# Patient Record
Sex: Female | Born: 1995 | Race: White | Hispanic: No | Marital: Married | Smoking: Never smoker
Health system: Southern US, Community
[De-identification: ages and names within clinical notes are randomized; demographics above are authoritative.]

## PROBLEM LIST (undated history)

## (undated) ENCOUNTER — Inpatient Hospital Stay (HOSPITAL_COMMUNITY): Payer: Self-pay

## (undated) DIAGNOSIS — Z789 Other specified health status: Secondary | ICD-10-CM

## (undated) DIAGNOSIS — R519 Headache, unspecified: Secondary | ICD-10-CM

## (undated) DIAGNOSIS — Z3490 Encounter for supervision of normal pregnancy, unspecified, unspecified trimester: Secondary | ICD-10-CM

## (undated) DIAGNOSIS — H9319 Tinnitus, unspecified ear: Secondary | ICD-10-CM

## (undated) DIAGNOSIS — A749 Chlamydial infection, unspecified: Secondary | ICD-10-CM

## (undated) DIAGNOSIS — M419 Scoliosis, unspecified: Secondary | ICD-10-CM

## (undated) DIAGNOSIS — J45909 Unspecified asthma, uncomplicated: Secondary | ICD-10-CM

## (undated) DIAGNOSIS — L309 Dermatitis, unspecified: Secondary | ICD-10-CM

## (undated) DIAGNOSIS — F419 Anxiety disorder, unspecified: Secondary | ICD-10-CM

## (undated) DIAGNOSIS — F329 Major depressive disorder, single episode, unspecified: Secondary | ICD-10-CM

## (undated) HISTORY — DX: Anxiety disorder, unspecified: F41.9

## (undated) HISTORY — PX: TONSILLECTOMY AND ADENOIDECTOMY: SHX28

## (undated) HISTORY — DX: Dermatitis, unspecified: L30.9

## (undated) HISTORY — DX: Unspecified asthma, uncomplicated: J45.909

## (undated) HISTORY — DX: Chlamydial infection, unspecified: A74.9

## (undated) HISTORY — PX: TONSILLECTOMY AND ADENOIDECTOMY: SUR1326

## (undated) HISTORY — DX: Tinnitus, unspecified ear: H93.19

## (undated) HISTORY — DX: Major depressive disorder, single episode, unspecified: F32.9

## (undated) HISTORY — DX: Headache, unspecified: R51.9

## (undated) HISTORY — PX: ADENOIDECTOMY: SUR15

---
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
## 2015-01-02 ENCOUNTER — Encounter: Payer: Self-pay | Admitting: Obstetrics & Gynecology

## 2015-01-02 ENCOUNTER — Ambulatory Visit (INDEPENDENT_AMBULATORY_CARE_PROVIDER_SITE_OTHER): Payer: Medicaid Other | Admitting: Obstetrics & Gynecology

## 2015-01-02 VITALS — BP 100/60 | HR 84 | Wt 157.3 lb

## 2015-01-02 DIAGNOSIS — Z3483 Encounter for supervision of other normal pregnancy, third trimester: Secondary | ICD-10-CM | POA: Diagnosis not present

## 2015-01-02 DIAGNOSIS — Z3493 Encounter for supervision of normal pregnancy, unspecified, third trimester: Secondary | ICD-10-CM

## 2015-01-02 DIAGNOSIS — Z3A38 38 weeks gestation of pregnancy: Secondary | ICD-10-CM

## 2015-01-02 DIAGNOSIS — Z1389 Encounter for screening for other disorder: Secondary | ICD-10-CM

## 2015-01-02 DIAGNOSIS — Z331 Pregnant state, incidental: Secondary | ICD-10-CM

## 2015-01-02 LAB — POCT URINALYSIS DIPSTICK
Glucose, UA: NEGATIVE
KETONES UA: NEGATIVE
Nitrite, UA: NEGATIVE
RBC UA: NEGATIVE

## 2015-01-02 LAB — STREP GP B SUSCEPTIBILITY

## 2015-01-02 LAB — STREP GP B CULTURE+RFLX: STREP GP B CULTURE+RFLX: POSITIVE — AB

## 2015-01-02 MED ORDER — OMEPRAZOLE 20 MG PO CPDR: 20.0000 mg | DELAYED_RELEASE_CAPSULE | Freq: Every day | ORAL | Status: DC

## 2015-01-02 NOTE — Progress Notes (Signed)
G2P1001 6040w0d Estimated Date of Delivery: 01/16/15  Blood pressure 100/60, pulse 84, weight 157 lb 4.8 oz (71.351 kg), last menstrual period 03/20/2014, not currently breastfeeding.   BP weight and urine results all reviewed and noted.  Please refer to the obstetrical flow sheet for the fundal height and fetal heart rate documentation:  Patient reports good fetal movement, denies any bleeding and no rupture of membranes symptoms or regular contractions. Patient is without complaints. All questions were answered.  Orders Placed This Encounter  Procedures  . POCT urinalysis dipstick    Plan:  Continued routine obstetrical care,   Return in about 1 week (around 01/09/2015) for LROB, with Dr Despina HiddenEure.

## 2015-01-08 ENCOUNTER — Encounter (HOSPITAL_COMMUNITY): Payer: Self-pay

## 2015-01-08 ENCOUNTER — Inpatient Hospital Stay (HOSPITAL_COMMUNITY)
Admission: AD | Admit: 2015-01-08 | Discharge: 2015-01-11 | DRG: 775 | Disposition: A | Discharge status: Home / Self Care | Payer: Medicaid Other | Source: Ambulatory Visit | Attending: Obstetrics & Gynecology | Admitting: Obstetrics & Gynecology

## 2015-01-08 DIAGNOSIS — O26893 Other specified pregnancy related conditions, third trimester: Secondary | ICD-10-CM | POA: Diagnosis present

## 2015-01-08 DIAGNOSIS — O9962 Diseases of the digestive system complicating childbirth: Secondary | ICD-10-CM | POA: Diagnosis present

## 2015-01-08 DIAGNOSIS — F1290 Cannabis use, unspecified, uncomplicated: Secondary | ICD-10-CM | POA: Diagnosis present

## 2015-01-08 DIAGNOSIS — Z6791 Unspecified blood type, Rh negative: Secondary | ICD-10-CM | POA: Diagnosis not present

## 2015-01-08 DIAGNOSIS — O26899 Other specified pregnancy related conditions, unspecified trimester: Secondary | ICD-10-CM | POA: Diagnosis present

## 2015-01-08 DIAGNOSIS — O99824 Streptococcus B carrier state complicating childbirth: Secondary | ICD-10-CM | POA: Diagnosis present

## 2015-01-08 DIAGNOSIS — Z3490 Encounter for supervision of normal pregnancy, unspecified, unspecified trimester: Secondary | ICD-10-CM

## 2015-01-08 DIAGNOSIS — Z833 Family history of diabetes mellitus: Secondary | ICD-10-CM | POA: Diagnosis not present

## 2015-01-08 DIAGNOSIS — Z8249 Family history of ischemic heart disease and other diseases of the circulatory system: Secondary | ICD-10-CM

## 2015-01-08 DIAGNOSIS — Z3A38 38 weeks gestation of pregnancy: Secondary | ICD-10-CM | POA: Diagnosis not present

## 2015-01-08 DIAGNOSIS — Z821 Family history of blindness and visual loss: Secondary | ICD-10-CM

## 2015-01-08 DIAGNOSIS — IMO0001 Reserved for inherently not codable concepts without codable children: Secondary | ICD-10-CM

## 2015-01-08 DIAGNOSIS — K219 Gastro-esophageal reflux disease without esophagitis: Secondary | ICD-10-CM | POA: Diagnosis present

## 2015-01-08 DIAGNOSIS — M419 Scoliosis, unspecified: Secondary | ICD-10-CM | POA: Diagnosis present

## 2015-01-08 DIAGNOSIS — Z3493 Encounter for supervision of normal pregnancy, unspecified, third trimester: Secondary | ICD-10-CM

## 2015-01-08 DIAGNOSIS — O360190 Maternal care for anti-D [Rh] antibodies, unspecified trimester, not applicable or unspecified: Secondary | ICD-10-CM | POA: Diagnosis present

## 2015-01-08 NOTE — H&P (Signed)
OBSTETRIC ADMISSION HISTORY AND PHYSICAL  [name] is a 19 y.o. female G2P1001 with IUP at [redacted]w[redacted]d by U/S presenting for active labor. She reports +FMs, No LOF, no VB, no blurry vision, headaches or peripheral edema, and RUQ pain.  She plans on breast and bottle feeding. She request Depo for birth control.  Dating: By U/S --->  Estimated Date of Delivery: 01/16/15  Sono:  [place] 37+1wks, cephalic, post placenta gr 2, fhr 171 bpm, afi 10cm, efw 3174g 58%   Prenatal History/Complications: Rh Negative  [facility]  Initiated Care at  11.5wks  FOB Salome Holmes, 18yo, 1st baby, dating  Dating By 10wk u/s  Pap <21yo  GC/CT Initial: -/- 36+wks: -/-  Genetic Screen NT/IT: normal  CF screen Neg last preg  Anatomic [place] Normal female  Flu vaccine Recommended  Tdap Recommended ~ 28wks  Glucose Screen  2 hr 76/85/87  GBS Pos, cleocin resistant- needs Vanc  Feed Preference Breast/bottle  Contraception depo  Circumcision n/a  Childbirth Classes Interested, info given  Pediatrician [facility]       Past Medical History: Past Medical History  Diagnosis Date  . Asthma   . Eczema   . Pregnant   . Medical history non-contributory   . Scoliosis   . Chlamydia infection   . Depression 2016  . Pregnant 06/13/2014  . Anxiety     Past Surgical History: Past Surgical History  Procedure Laterality Date  . Tonsillectomy and adenoidectomy    . No past surgeries    . Tonsillectomy and adenoidectomy      Obstetrical History: OB History    Gravida Para Term Preterm AB TAB SAB Ectopic Multiple Living   Social History: Social History   Social History  . Marital Status: Married    Spouse Name: N/A  . Number of Children: N/A  . Years of Education: N/A   Social History Main Topics  . Smoking status: Never Smoker   . Smokeless tobacco: Never Used  . Alcohol Use: No  . Drug Use: No  . Sexual Activity: Yes    Birth Control/ Protection:  None   Other Topics Concern  . None   Social History Narrative    Family History: Family History  Problem Relation Age of Onset  . Hypertension Father   . Depression Father   . Vision loss Father   . Asthma Paternal Aunt   . Thyroid disease Paternal Aunt   . Hyperlipidemia Maternal Grandfather   . Heart disease Paternal Grandmother   . Hypertension Paternal Grandmother   . Diabetes Paternal Grandfather   . Hyperlipidemia Paternal Grandfather   . Asthma Paternal Aunt     Allergies: Allergies  Allergen Reactions  . Penicillins     Unsure of reaction.    Prescriptions prior to admission  Medication Sig Dispense Refill Last Dose  . acetaminophen (TYLENOL) 500 MG tablet Take 500 mg by mouth as needed. Reported on 01/02/2015   Past Month at Unknown time  . ALBUTEROL SULFATE HFA IN Inhale into the lungs as needed. Reported on 01/02/2015   Past Week at Unknown time  . omeprazole (PRILOSEC) 20 MG capsule Take 1 capsule (20 mg total) by mouth daily. 1 tablet a day 30 capsule 6 01/07/2015 at Unknown time  . prenatal vitamin w/FE, FA (PRENATAL 1 + 1) 27-1 MG TABS tablet Take 1 tablet by mouth daily at 12 noon.  30 each 11 Past Week at Unknown time     Review of Systems   All systems reviewed and negative except as stated in HPI  Blood pressure 130/75, pulse 101, temperature 97.8 F (36.6 C), temperature source Oral, resp. rate 20, height 5\' 5"  (1.651 m), weight 70.988 kg (156 lb 8 oz), last menstrual period 03/20/2014, not currently breastfeeding. General appearance: alert, cooperative, appears stated age and mild distress Lungs: clear to auscultation bilaterally Heart: regular rate and rhythm Abdomen: soft, non-tender; bowel sounds normal Extremities: Homans sign is negative, no sign of DVT Presentation: cephalic Fetal monitoringBaseline: 130 bpm, Variability: Good {> 6 bpm) and Accelerations: Non-reactive but appropriate for gestational age Uterine activity - q3-735min   Dilation: 5 Effacement (%): 90 Station: -2 Exam by:: Remigio EisenmengerBenji Stanley Rn   Prenatal labs: ABO, Rh: B/Negative/-- (06/13 1205) Antibody: Negative (10/05 0914) Rubella: IMMUNE RPR: Non Reactive (10/05 0914)  HBsAg: Negative (06/13 1205)  HIV: Non Reactive (10/05 0914)  GBS: Positive (12/07 0000)  1 hr Glucola 76 Genetic screening  normal Anatomy US wnl, female  Prenatal Transfer Tool  Maternal Diabetes: No Genetic Screening: Normal Maternal Ultrasounds/Referrals: Normal Fetal Ultrasounds or other Referrals:  None Maternal Substance Abuse:  Yes:  Type: Marijuana Significant Maternal Medications:  None Significant Maternal Lab Results: Lab values include: Group B Strep positive, Rh negative  No results found for this or any previous visit (from the past 24 hour(s)).  Patient Active Problem List   Diagnosis Date Noted  . Supervision of normal pregnancy 06/13/2014  . History of chlamydia 04/03/2014  . STD exposure 01/15/2014  . 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 Rh negative status during pregnancy 10/29/2012    Assessment: [name] is a 19 y.o. G2P1001 at 7055w6d here for spontaneous onset of labor.   #Labor: Allow to labor on her own at this point, can augment if necessary. No AROM until adequate GBS prophylaxis.  #Pain: Epidural available if needed.  #FWB: Reassuring, Cat 1 #ID: GBS POSITIVE, Cleocin resistant, will treat with Vanc.  #MOF: Breast and Bottle #MOC: Depo Provera #Circ:  N/A  Olivia Cantermanda Schultz 01/08/2015, 11:55 PM  OB fellow attestation: I have seen and examined this patient; I agree with above documentation in the resident's note.   [name] is a 19 y.o. G2P1001 here for active labor  PE: BP 116/69 mmHg  Pulse 87  Temp(Src) 98.3 F (36.8 C) (Oral)  Resp 20  Ht 5\' 5"  (1.651 m)  Wt 156 lb (70.761 kg)  BMI 25.96 kg/m2  SpO2 96%  LMP 03/20/2014 (Approximate) Gen: calm comfortable, NAD Resp: normal effort, no distress Abd:  gravid  ROS, labs, PMH reviewed  Plan: Admit patient to LD, currently in active labor GBS pos but PCN allergic and isolate resistant to clindamycin. Vancomycin ordered Breast and bottle Desires Depo for contraception Will need PP rhogam  Family Medicine, OB Fellow 01/09/2015, 2:01 AM

## 2015-01-08 NOTE — MAU Note (Signed)
PT  SAYS [facility]   [facility]  LAST  WEEK -  3    CM.    UC  - HURT  BAD  AT 6PM.     DENIES HSV AND MRSA.    GBS- POSITIVE .

## 2015-01-09 ENCOUNTER — Encounter: Payer: Medicaid Other | Admitting: Obstetrics & Gynecology

## 2015-01-09 ENCOUNTER — Inpatient Hospital Stay (HOSPITAL_COMMUNITY): Payer: Medicaid Other | Admitting: Anesthesiology

## 2015-01-09 ENCOUNTER — Encounter (HOSPITAL_COMMUNITY): Payer: Self-pay | Admitting: Anesthesiology

## 2015-01-09 DIAGNOSIS — Z3A38 38 weeks gestation of pregnancy: Secondary | ICD-10-CM

## 2015-01-09 DIAGNOSIS — O26893 Other specified pregnancy related conditions, third trimester: Secondary | ICD-10-CM

## 2015-01-09 DIAGNOSIS — O9962 Diseases of the digestive system complicating childbirth: Secondary | ICD-10-CM

## 2015-01-09 DIAGNOSIS — IMO0001 Reserved for inherently not codable concepts without codable children: Secondary | ICD-10-CM

## 2015-01-09 DIAGNOSIS — K219 Gastro-esophageal reflux disease without esophagitis: Secondary | ICD-10-CM

## 2015-01-09 DIAGNOSIS — O99824 Streptococcus B carrier state complicating childbirth: Secondary | ICD-10-CM

## 2015-01-09 DIAGNOSIS — M419 Scoliosis, unspecified: Secondary | ICD-10-CM

## 2015-01-09 DIAGNOSIS — Z6791 Unspecified blood type, Rh negative: Secondary | ICD-10-CM

## 2015-01-09 LAB — CBC
HCT: 33.5 % — ABNORMAL LOW (ref 36.0–46.0)
HEMATOCRIT: 40 % (ref 36.0–46.0)
HEMOGLOBIN: 13.3 g/dL (ref 12.0–15.0)
Hemoglobin: 11 g/dL — ABNORMAL LOW (ref 12.0–15.0)
MCH: 27.7 pg (ref 26.0–34.0)
MCH: 28.1 pg (ref 26.0–34.0)
MCHC: 32.8 g/dL (ref 30.0–36.0)
MCHC: 33.3 g/dL (ref 30.0–36.0)
MCV: 84.4 fL (ref 78.0–100.0)
MCV: 84.6 fL (ref 78.0–100.0)
PLATELETS: 245 10*3/uL (ref 150–400)
Platelets: 282 10*3/uL (ref 150–400)
RBC: 3.97 MIL/uL (ref 3.87–5.11)
RBC: 4.73 MIL/uL (ref 3.87–5.11)
RDW: 14.6 % (ref 11.5–15.5)
RDW: 14.6 % (ref 11.5–15.5)
WBC: 16.5 10*3/uL — AB (ref 4.0–10.5)
WBC: 22.2 10*3/uL — AB (ref 4.0–10.5)

## 2015-01-09 LAB — RPR: RPR: NONREACTIVE

## 2015-01-09 MED ORDER — OXYCODONE-ACETAMINOPHEN 5-325 MG PO TABS: 1.0000 | ORAL_TABLET | ORAL | Status: DC | PRN

## 2015-01-09 MED ORDER — LACTATED RINGERS IV SOLN
INTRAVENOUS | Status: DC
  Administered 2015-01-09: via INTRAVENOUS

## 2015-01-09 MED ORDER — ACETAMINOPHEN 325 MG PO TABS: 650.0000 mg | ORAL_TABLET | ORAL | Status: DC | PRN

## 2015-01-09 MED ORDER — INFLUENZA VAC SPLIT QUAD 0.5 ML IM SUSY
0.5000 mL | PREFILLED_SYRINGE | INTRAMUSCULAR | Status: AC
  Administered 2015-01-11: 0.5 mL via INTRAMUSCULAR
  Filled 2015-01-09: qty 0.5

## 2015-01-09 MED ORDER — DIPHENHYDRAMINE HCL 50 MG/ML IJ SOLN: 12.5000 mg | INTRAMUSCULAR | Status: DC | PRN

## 2015-01-09 MED ORDER — OXYCODONE-ACETAMINOPHEN 5-325 MG PO TABS: 2.0000 | ORAL_TABLET | ORAL | Status: DC | PRN

## 2015-01-09 MED ORDER — VANCOMYCIN HCL IN DEXTROSE 1-5 GM/200ML-% IV SOLN
1000.0000 mg | Freq: Two times a day (BID) | INTRAVENOUS | Status: DC
  Administered 2015-01-09: 1000 mg via INTRAVENOUS
  Filled 2015-01-09 (×2): qty 200

## 2015-01-09 MED ORDER — CITRIC ACID-SODIUM CITRATE 334-500 MG/5ML PO SOLN: 30.0000 mL | ORAL | Status: DC | PRN

## 2015-01-09 MED ORDER — EPHEDRINE 5 MG/ML INJ
10.0000 mg | INTRAVENOUS | Status: DC | PRN
Start: 2015-01-09 — End: 2015-01-09

## 2015-01-09 MED ORDER — FENTANYL 2.5 MCG/ML BUPIVACAINE 1/10 % EPIDURAL INFUSION (WH - ANES)
14.0000 mL/h | INTRAMUSCULAR | Status: DC | PRN
  Administered 2015-01-09: 14 mL/h via EPIDURAL
  Filled 2015-01-09: qty 125

## 2015-01-09 MED ORDER — OXYTOCIN 40 UNITS IN LACTATED RINGERS INFUSION - SIMPLE MED
62.5000 mL/h | INTRAVENOUS | Status: DC
  Administered 2015-01-09: 62.5 mL/h via INTRAVENOUS
  Filled 2015-01-09: qty 1000

## 2015-01-09 MED ORDER — LIDOCAINE HCL (PF) 1 % IJ SOLN
30.0000 mL | INTRAMUSCULAR | Status: DC | PRN
  Filled 2015-01-09: qty 30

## 2015-01-09 MED ORDER — BENZOCAINE-MENTHOL 20-0.5 % EX AERO
1.0000 "application " | INHALATION_SPRAY | CUTANEOUS | Status: DC | PRN
  Filled 2015-01-09: qty 56

## 2015-01-09 MED ORDER — OXYTOCIN BOLUS FROM INFUSION
500.0000 mL | INTRAVENOUS | Status: DC
  Administered 2015-01-09: 500 mL via INTRAVENOUS

## 2015-01-09 MED ORDER — LANOLIN HYDROUS EX OINT: TOPICAL_OINTMENT | CUTANEOUS | Status: DC | PRN

## 2015-01-09 MED ORDER — SENNOSIDES-DOCUSATE SODIUM 8.6-50 MG PO TABS
2.0000 | ORAL_TABLET | ORAL | Status: DC
  Administered 2015-01-10 – 2015-01-11 (×2): 2 via ORAL
  Filled 2015-01-09 (×2): qty 2

## 2015-01-09 MED ORDER — DIBUCAINE 1 % RE OINT
1.0000 "application " | TOPICAL_OINTMENT | RECTAL | Status: DC | PRN
  Filled 2015-01-09: qty 28

## 2015-01-09 MED ORDER — ALBUTEROL SULFATE (2.5 MG/3ML) 0.083% IN NEBU: 3.0000 mL | INHALATION_SOLUTION | RESPIRATORY_TRACT | Status: DC | PRN

## 2015-01-09 MED ORDER — TETANUS-DIPHTH-ACELL PERTUSSIS 5-2.5-18.5 LF-MCG/0.5 IM SUSP
0.5000 mL | Freq: Once | INTRAMUSCULAR | Status: AC
  Administered 2015-01-10: 0.5 mL via INTRAMUSCULAR
  Filled 2015-01-09: qty 0.5

## 2015-01-09 MED ORDER — ONDANSETRON HCL 4 MG/2ML IJ SOLN: 4.0000 mg | Freq: Four times a day (QID) | INTRAMUSCULAR | Status: DC | PRN

## 2015-01-09 MED ORDER — IBUPROFEN 600 MG PO TABS
600.0000 mg | ORAL_TABLET | Freq: Four times a day (QID) | ORAL | Status: DC
  Administered 2015-01-09 – 2015-01-11 (×9): 600 mg via ORAL
  Filled 2015-01-09 (×9): qty 1

## 2015-01-09 MED ORDER — FENTANYL CITRATE (PF) 100 MCG/2ML IJ SOLN: 50.0000 ug | INTRAMUSCULAR | Status: DC | PRN

## 2015-01-09 MED ORDER — ONDANSETRON HCL 4 MG PO TABS: 4.0000 mg | ORAL_TABLET | ORAL | Status: DC | PRN

## 2015-01-09 MED ORDER — WITCH HAZEL-GLYCERIN EX PADS: 1.0000 "application " | MEDICATED_PAD | CUTANEOUS | Status: DC | PRN

## 2015-01-09 MED ORDER — ONDANSETRON HCL 4 MG/2ML IJ SOLN: 4.0000 mg | INTRAMUSCULAR | Status: DC | PRN

## 2015-01-09 MED ORDER — DIPHENHYDRAMINE HCL 25 MG PO CAPS: 25.0000 mg | ORAL_CAPSULE | Freq: Four times a day (QID) | ORAL | Status: DC | PRN

## 2015-01-09 MED ORDER — LACTATED RINGERS IV SOLN: 500.0000 mL | INTRAVENOUS | Status: DC | PRN

## 2015-01-09 MED ORDER — ZOLPIDEM TARTRATE 5 MG PO TABS: 5.0000 mg | ORAL_TABLET | Freq: Every evening | ORAL | Status: DC | PRN

## 2015-01-09 MED ORDER — PRENATAL MULTIVITAMIN CH
1.0000 | ORAL_TABLET | Freq: Every day | ORAL | Status: DC
  Administered 2015-01-09 – 2015-01-11 (×2): 1 via ORAL
  Filled 2015-01-09 (×2): qty 1

## 2015-01-09 MED ORDER — PHENYLEPHRINE 40 MCG/ML (10ML) SYRINGE FOR IV PUSH (FOR BLOOD PRESSURE SUPPORT)
80.0000 ug | PREFILLED_SYRINGE | INTRAVENOUS | Status: AC | PRN
  Administered 2015-01-09 (×3): 80 ug via INTRAVENOUS
  Filled 2015-01-09: qty 20

## 2015-01-09 MED ORDER — PHENYLEPHRINE 40 MCG/ML (10ML) SYRINGE FOR IV PUSH (FOR BLOOD PRESSURE SUPPORT)
80.0000 ug | PREFILLED_SYRINGE | Freq: Once | INTRAVENOUS | Status: AC
  Administered 2015-01-09: 80 ug via INTRAVENOUS

## 2015-01-09 MED ORDER — SIMETHICONE 80 MG PO CHEW
80.0000 mg | CHEWABLE_TABLET | ORAL | Status: DC | PRN
Start: 2015-01-09 — End: 2015-01-11

## 2015-01-09 MED ORDER — LIDOCAINE HCL (PF) 1 % IJ SOLN
INTRAMUSCULAR | Status: DC | PRN
  Administered 2015-01-09 (×2): 4 mL via EPIDURAL

## 2015-01-09 NOTE — Lactation Note (Signed)
This note was copied from the chart of Joanne Houa [name]. Lactation Consultation Note Initial visit at 15 hours of age.  Mom reports 2 feedings with only a few sucks.  Mom has pumped with DEBP once and did not collect any EBM.  Baby very sleepy and mom reports gagging and spitting up.  Offered to assist with hand expression and collected 2mls on spoon and given back to baby per moms request.   Baby did not swallow well and is probably feeling full of fluid from delivery.  [facility] given and discussed.  Encouraged to feed with early cues on demand.  Early newborn behavior discussed.  Mom to call for assist as needed.    Patient Name: [name] Today's Date: 01/09/2015 Reason for consult: Initial assessment   Maternal Data Has patient been taught Hand Expression?: Yes Does the patient have breastfeeding experience prior to this delivery?: Yes  Feeding Feeding Type: Breast Fed Length of feed:  (few sucks with colostrum seen in mouth)  LATCH Score/Interventions Latch: Too sleepy or reluctant, no latch achieved, no sucking elicited. Intervention(s): Skin to skin;Teach feeding cues;Waking techniques Intervention(s): Adjust position;Assist with latch;Breast compression  Audible Swallowing: None Intervention(s): Skin to skin;Hand expression  Type of Nipple: Everted at rest and after stimulation  Comfort (Breast/Nipple): Soft / non-tender     Hold (Positioning): Assistance needed to correctly position infant at breast and maintain latch. Intervention(s): Breastfeeding basics reviewed;Support Pillows;Position options;Skin to skin  LATCH Score: 5  Lactation Tools Discussed/Used Pump Review: Setup, frequency, and cleaning;Milk Storage Initiated by:: meghan garrison rn Date initiated:: 01/09/15   Consult Status Consult Status: Follow-up Date: 01/10/15 Follow-up type: In-patient    Jannifer RodneyShoptaw, Jana Lynn 01/09/2015, 6:25 PM

## 2015-01-09 NOTE — Anesthesia Procedure Notes (Signed)
Epidural Patient location during procedure: OB Start time: 01/09/2015 1:01 AM  Staffing Anesthesiologist: Mal AmabileFOSTER, Rachyl Wuebker Performed by: anesthesiologist   Preanesthetic Checklist Completed: patient identified, site marked, surgical consent, pre-op evaluation, timeout performed, IV checked, risks and benefits discussed and monitors and equipment checked  Epidural Patient position: sitting Prep: site prepped and draped and DuraPrep Patient monitoring: continuous pulse ox and blood pressure Approach: midline Location: L3-L4 Injection technique: LOR air  Needle:  Needle type: Tuohy  Needle gauge: 17 G Needle length: 9 cm and 9 Needle insertion depth: 5 cm cm Catheter type: closed end flexible Catheter size: 19 Gauge Catheter at skin depth: 10 cm Test dose: negative and Other  Assessment Events: blood not aspirated, injection not painful, no injection resistance, negative IV test and no paresthesia  Additional Notes Patient identified. Risks and benefits discussed including failed block, incomplete  Pain control, post dural puncture headache, nerve damage, paralysis, blood pressure Changes, nausea, vomiting, reactions to medications-both toxic and allergic and post Partum back pain. All questions were answered. Patient expressed understanding and wished to proceed. Sterile technique was used throughout procedure. Epidural site was Dressed with sterile barrier dressing. No paresthesias, signs of intravascular injection Or signs of intrathecal spread were encountered.  Patient was more comfortable after the epidural was dosed. Please see RN's note for documentation of vital signs and FHR which are stable.

## 2015-01-09 NOTE — Anesthesia Preprocedure Evaluation (Addendum)
Anesthesia Evaluation  Patient identified by MRN, date of birth, ID band Patient awake    Reviewed: Allergy & Precautions, Patient's Chart, lab work & pertinent test results  Airway Mallampati: II  TM Distance: >3 FB Neck ROM: Full    Dental no notable dental hx. (+) Teeth Intact   Pulmonary asthma ,    Pulmonary exam normal breath sounds clear to auscultation       Cardiovascular negative cardio ROS Normal cardiovascular exam Rhythm:Regular Rate:Normal     Neuro/Psych PSYCHIATRIC DISORDERS Anxiety Depression negative neurological ROS     GI/Hepatic Neg liver ROS, GERD  Medicated and Controlled,  Endo/Other  negative endocrine ROS  Renal/GU   negative genitourinary   Musculoskeletal Scoliosis   Abdominal   Peds  Hematology negative hematology ROS (+)   Anesthesia Other Findings   Reproductive/Obstetrics (+) Pregnancy                            Anesthesia Physical Anesthesia Plan  ASA: II  Anesthesia Plan: Epidural   Post-op Pain Management:    Induction:   Airway Management Planned: Natural Airway  Additional Equipment:   Intra-op Plan:   Post-operative Plan:   Informed Consent: I have reviewed the patients History and Physical, chart, labs and discussed the procedure including the risks, benefits and alternatives for the proposed anesthesia with the patient or authorized representative who has indicated his/her understanding and acceptance.     Plan Discussed with: Anesthesiologist  Anesthesia Plan Comments:         Anesthesia Quick Evaluation

## 2015-01-09 NOTE — Anesthesia Postprocedure Evaluation (Signed)
Anesthesia Post Note  Patient: [name]  Procedure(s) Performed: * No procedures listed *  Patient location during evaluation: L&D Anesthesia Type: Epidural Level of consciousness: awake and alert and oriented Pain management: pain level controlled Vital Signs Assessment: post-procedure vital signs reviewed and stable Respiratory status: spontaneous breathing and respiratory function stable Cardiovascular status: blood pressure returned to baseline Postop Assessment: no headache, no backache, patient able to bend at knees, no signs of nausea or vomiting and adequate PO intake Anesthetic complications: no    Last Vitals:  Filed Vitals:   01/09/15 0632 01/09/15 1019  BP: 120/65 122/75  Pulse: 85 67  Temp: 36.4 C   Resp: 20 20    Last Pain:  Filed Vitals:   01/09/15 1214  PainSc: 2                  [name]

## 2015-01-10 MED ORDER — RHO D IMMUNE GLOBULIN 1500 UNIT/2ML IJ SOSY
300.0000 ug | PREFILLED_SYRINGE | Freq: Once | INTRAMUSCULAR | Status: AC
  Administered 2015-01-10: 300 ug via INTRAMUSCULAR
  Filled 2015-01-10: qty 2

## 2015-01-10 NOTE — Progress Notes (Addendum)
Post Partum Day 1 Subjective:  [name] is a 19 y.o. [id number] 3469w0d s/p NSVD.  No acute events overnight.  Pt denies problems with ambulating, voiding or po intake.  She denies nausea or vomiting.  Pain is well controlled.  She has had flatus.  Lochia Minimal.  Plan for birth control is Depo-Provera.  Method of Feeding: Breat.   Objective: Blood pressure 96/65, pulse 84, temperature 98.3 F (36.8 C), temperature source Oral, resp. rate 18, height 5\' 5"  (1.651 m), weight 70.761 kg (156 lb), last menstrual period 03/20/2014, SpO2 100 %, unknown if currently breastfeeding.  Physical Exam:  General: alert, cooperative and no distress Lochia:normal flow Chest: normal WOB Heart: Regular rate Abdomen: +BS, soft, mild TTP (appropriate) Uterine Fundus: firm, below umbillicus DVT Evaluation: No evidence of DVT seen on physical exam. Extremities: No edema   Recent Labs  01/09/15 0010 01/09/15 0750  HGB 13.3 11.0*  HCT 40.0 33.5*    Assessment/Plan:  ASSESSMENT: [name] is a 19 y.o. [id number] 5969w0d s/p NSVD.   Plan for discharge tomorrow as baby needs 48 hr obs for inadeq GBS prophylaxis. Depo inj below discharge Rhogam if indicated Continue routine PP care Breastfeeding support PRN  LOS: 2 days   Olivia Cantermanda Schultz 01/10/2015, 7:26 AM   CNM attestation Post Partum Day #1 I have seen and examined this patient and agree with above documentation in the resident's note.   [name] is a 19 y.o. [id number] s/p SVD.  Pt denies problems with ambulating, voiding or po intake. Pain is well controlled.  Plan for birth control is Depo-Provera.  Method of Feeding: breast and bottle  PE:  BP 96/65 mmHg  Pulse 84  Temp(Src) 98.3 F (36.8 C) (Oral)  Resp 18  Ht 5\' 5"  (1.651 m)  Wt 70.761 kg (156 lb)  BMI 25.96 kg/m2  SpO2 100%  LMP 03/20/2014 (Approximate)  Breastfeeding? Unknown Fundus firm  Plan for discharge: 01/11/15  Cam HaiSHAW, Labrian Torregrossa, CNM 8:47 AM   01/10/2015
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
## 2015-01-10 NOTE — Lactation Note (Addendum)
This note was copied from the chart of Joanne Lupe [name]. Lactation Consultation Note  Patient Name: [name] Today's Date: 01/10/2015 Reason for consult: Follow-up assessment;Other (Comment) (Baby "gaggy and spitty.")  Baby 34 hours old. Mom states that baby has continued to spit and gag, but has started nursing for longer periods of time. Mom reports that she pumped and bottle-fed first child, but is having an easier time latching this child. Mom states that she gave 1 bottle of formula, and baby has latched better since getting that formula. "Discussed supply and demand and normal progression of milk coming to volume. Enc mom to continue to offer lots of STS and attempts a breast with cues. Enc mom to call out for assistance with latching as needed. Mom states that she has a DEBP at home.  Maternal Data    Feeding    LATCH Score/Interventions                      Lactation Tools Discussed/Used     Consult Status Consult Status: Follow-up Date: 01/11/15 Follow-up type: In-patient    Geralynn OchsWILLIARD, Jhamir Pickup 01/10/2015, 2:01 PM

## 2015-01-11 LAB — RH IG WORKUP (INCLUDES ABO/RH)
ABO/RH(D): B NEG
Fetal Screen: NEGATIVE
GESTATIONAL AGE(WKS): 39
Unit division: 0

## 2015-01-11 LAB — TYPE AND SCREEN
ABO/RH(D): B NEG
ANTIBODY SCREEN: POSITIVE
DAT, IGG: NEGATIVE
UNIT DIVISION: 0
Unit division: 0

## 2015-01-11 MED ORDER — IBUPROFEN 600 MG PO TABS: 600.0000 mg | ORAL_TABLET | Freq: Four times a day (QID) | ORAL | Status: DC

## 2015-01-11 NOTE — Lactation Note (Signed)
This note was copied from the chart of Joanne Calin [name]. Lactation Consultation Note  Patient Name: [name] Today's Date: 01/11/2015 Reason for consult: Follow-up assessment Mom reports baby will latch but she plans to pump/bottle for 2 weeks then switch to formula. Mom has her own Evenflo Single electric pump which Mom reports she used in the past. Mom reports she pump/bottle her 1st child for 2 weeks then switched to formula because she was going back to school. She has to return to school in 2 weeks with this baby as well. Encouraged Mom to pump every 3 hours for 15 minutes if she can. Guidelines for feeding with formula without breastfeeding given to and reviewed with Mom. Advised baby should have at least 30 ml with each feeding by hours of age. More is baby wants more. Engorgement care reviewed if needed. How to wean and dry milk when ready discussed. Offered assist with latch, Mom declined. Mom did pump prior to my visit and received 2 ml of colostrum. Advised Mom if she decides to pump/bottle longer than 2 weeks then advised Mom WIC may be able to help her with DEBP to make pump/bottle easier. Encouraged to call for questions/concerns.   Maternal Data    Feeding    LATCH Score/Interventions                      Lactation Tools Discussed/Used Tools: Pump;Comfort gels Breast pump type: Other (comment) (Evenflo single electric breast pump)   Consult Status Consult Status: Complete Date: 01/11/15 Follow-up type: In-patient    Alfred LevinsGranger, Courage Biglow Ann 01/11/2015, 12:42 PM

## 2015-01-11 NOTE — Discharge Summary (Signed)
OB Discharge Summary     Patient Name: [name] DOB: 1995/02/04 MRN: [id number]  Date of admission: 01/08/2015 Delivering MD: Olivia Canter   Date of discharge: 01/11/2015  Admitting diagnosis: 39w labor, blood Intrauterine pregnancy: [redacted]w[redacted]d     Secondary diagnosis:  Principal Problem:   NSVD (normal spontaneous vaginal delivery) Active Problems:   Marijuana use   Rh negative status during pregnancy   Supervision of normal pregnancy   Active labor at term  Additional problems: None      Discharge diagnosis: Term Pregnancy Delivered                                                                                                Post partum procedures:rhogam  Augmentation: None  Complications: None  Hospital course:  Onset of Labor With Vaginal Delivery     19 y.o. yo G2P2002 at [redacted]w[redacted]d was admitted in Active Laboron 01/08/2015. Patient is GBS positive and was treated with vancomycin, however was not adequate at time of delivery.  Patient had an uncomplicated labor course as follows:  Membrane Rupture Time/Date: 3:14 AM ,01/09/2015   Intrapartum Procedures: Episiotomy: None [1]                                         Lacerations:  None [1]  Patient had a delivery of a Viable infant. 01/09/2015  Information for the patient's newborn:  Bullins, Girl Leilanie [[id number]]  Delivery Method: Vaginal, Spontaneous Delivery (Filed from Delivery Summary)    Pateint had an uncomplicated postpartum course.  She is ambulating, tolerating a regular diet, passing flatus, and urinating well. Patient is discharged home in stable condition on 01/11/2015  1:35 PM.  Patient received Rhogam PP.   Physical exam  Filed Vitals:   01/09/15 1832 01/10/15 0624 01/10/15 1900 01/11/15 0615  BP: 111/71 96/65 121/83 109/63  Pulse: 68 84 81 63  Temp: 98.5 F (36.9 C) 98.3 F (36.8 C) 98.2 F (36.8 C) 98 F (36.7 C)  TempSrc: Oral Oral Oral Oral  Resp: Height:       Weight:      SpO2:   98%    General: alert, cooperative and no distress Lochia: appropriate Uterine Fundus: firm Incision: N/A DVT Evaluation: No evidence of DVT seen on physical exam. Labs: Lab Results  Component Value Date   WBC 22.2* 01/09/2015   HGB 11.0* 01/09/2015   HCT 33.5* 01/09/2015   MCV 84.4 01/09/2015   PLT 245 01/09/2015   CMP 06/03/2007  Glucose 96  BUN 9  Creatinine 0.56  Sodium 136  Potassium 3.5  Chloride 105  CO2 29  Calcium 9.2  Total Protein 6.1  Total Bilirubin 0.8  Alkaline Phos 164  AST 22  ALT 18    Discharge instruction: per After Visit Summary and "Baby and Me Booklet".  After visit meds:    Medication List    STOP taking these medications        acetaminophen  500 MG tablet  Commonly known as:  TYLENOL     omeprazole 20 MG capsule  Commonly known as:  PRILOSEC      TAKE these medications        ALBUTEROL SULFATE HFA IN  Inhale into the lungs as needed. Reported on 01/02/2015     ibuprofen 600 MG tablet  Commonly known as:  ADVIL,MOTRIN  Take 1 tablet (600 mg total) by mouth every 6 (six) hours.     prenatal vitamin w/FE, FA 27-1 MG Tabs tablet  Take 1 tablet by mouth daily at 12 noon.        Diet: routine diet  Activity: Advance as tolerated. Pelvic rest for 6 weeks.   Outpatient follow up:6 weeks Follow up Appt:No future appointments. Follow up Visit:No Follow-up on file.  Postpartum contraception: Depo Provera  Newborn Data: Live born female  Birth Weight: 6 lb 15.5 oz (3161 g) APGAR: 9, 9  Baby Feeding: Bottle and Breast Disposition:home with mother   01/11/2015 Asiyah Angelene GiovanniZ Mikell, MD   OB FELLOW DISCHARGE ATTESTATION  I have seen and examined this patient and agree with above documentation in the resident's note. Rhogam given PP  Silvano BilisNoah B Kecia Swoboda, MD 4:23 PM

## 2015-01-11 NOTE — Lactation Note (Addendum)
This note was copied from the chart of [name]. Lactation Consultation Note  Patient Name: [name] Today's Date: 01/11/2015 Reason for consult: Follow-up assessment  With this mom of a term baby, now 4357 hours old, and "noth ungry" as per mom, . Mom has been breast and formula feeding. I reviewed with her how formula is more difficult to digest, and so keeps the baby asleep longer, and away from her breast. On exam, mom's breasts are full, with red, tender nipples. I told mom I would like to see her latch the baby, to make sure she was latching her deep enough. Also, I advised mom to pump , since she is now 56 hours post partum, and full. Mom has her single electric pump in the hospital room with her. Mom was going to use this pump, and then call for lactation to see how much she expressed, and how her breast feel after pumping with this pump. Mom reports some nipple tenderness, slightly red. Advised to apply EBM, comfort gels given with instructions. Flange appears to fit well but advised Mom to increase flange size with pump if tenderness continues.    Maternal Data    Feeding    LATCH Score/Interventions                      Lactation Tools Discussed/Used     Consult Status Consult Status: Follow-up Date: 01/11/15 Follow-up type: In-patient    Alfred LevinsLee, Christine Anne 01/11/2015, 12:22 PM

## 2015-01-17 ENCOUNTER — Ambulatory Visit (INDEPENDENT_AMBULATORY_CARE_PROVIDER_SITE_OTHER): Payer: Medicaid Other | Admitting: Obstetrics and Gynecology

## 2015-01-17 ENCOUNTER — Encounter: Payer: Self-pay | Admitting: Obstetrics and Gynecology

## 2015-01-17 VITALS — BP 118/70 | HR 68 | Ht 65.5 in | Wt 142.0 lb

## 2015-01-17 DIAGNOSIS — N63 Unspecified lump in breast: Secondary | ICD-10-CM

## 2015-01-17 DIAGNOSIS — N644 Mastodynia: Secondary | ICD-10-CM | POA: Diagnosis not present

## 2015-01-17 DIAGNOSIS — Q831 Accessory breast: Secondary | ICD-10-CM

## 2015-01-17 NOTE — Progress Notes (Signed)
Patient ID: [name], female   DOB: March 12, 1995, 19 y.o.   MRN: [id number]   [facility]  Patient name: [name] MRN [id number]  Date of birth: March 12, 1995  CC & HPI:  [name] is a 19 y.o. female who is 7 days postpartum, currently breastfeeding presenting today for a lump to the base of her left breast. Pt also complains of intermittent, sharp, shooting pains to bilateral breasts. Pt states she had no issue breastfeeding her first child. She denies redness to the affected area.   ROS:  A complete 10 system review of systems was obtained and all systems are negative except as noted in the HPI and PMH.    Pertinent History Reviewed:   Reviewed: Significant for 7 days postpartum, currently breastfeeding   Medical         Past Medical History  Diagnosis Date  . Asthma   . Eczema   . Pregnant   . Medical history non-contributory   . Scoliosis   . Chlamydia infection   . Depression 2016  . Pregnant 06/13/2014  . Anxiety                               Surgical Hx:    Past Surgical History  Procedure Laterality Date  . Tonsillectomy and adenoidectomy    . No past surgeries    . Tonsillectomy and adenoidectomy     Medications: Reviewed & Updated - see associated section                       Current outpatient prescriptions:  .  ALBUTEROL SULFATE HFA IN, Inhale into the lungs as needed. Reported on 01/02/2015, Disp: , Rfl:  .  ibuprofen (ADVIL,MOTRIN) 600 MG tablet, Take 1 tablet (600 mg total) by mouth every 6 (six) hours. (Patient taking differently: Take 600 mg by mouth as needed. ), Disp: 90 tablet, Rfl: 0   Social History: Reviewed -  reports that she has never smoked. She has never used smokeless tobacco.  Objective Findings:  Vitals: Blood pressure 118/70, pulse 68, height 5' 5.5" (1.664 m), weight 142 lb (64.411 kg), last menstrual period 03/20/2014, currently breastfeeding.  Physical Examination: General appearance - alert,  well appearing, and in no distress Breasts - 1cm firm, mobile nodule in the nipple line of the left breast. Right breast normal.       Assessment & Plan:   A:  1. 1 cm accessory tissue in the milk line on the left at the inframammary crease.    P:  1. Follow up prn  Pt reassured.      By signing my name below, I, Doreatha MartinEva Mathews, attest that this documentation has been prepared under the direction and in the presence of Tilda BurrowJohn Cedar Ditullio V, MD. Electronically Signed: Doreatha MartinEva Mathews, ED Scribe. 01/17/2015. 4:34 PM.  I personally performed the services described in this documentation, which was SCRIBED in my presence. The recorded information has been reviewed and considered accurate. It has been edited as necessary during review. Tilda BurrowFERGUSON,Renzo Vincelette V, MD

## 2015-02-25 ENCOUNTER — Encounter: Payer: Self-pay | Admitting: Women's Health

## 2015-02-25 ENCOUNTER — Ambulatory Visit (INDEPENDENT_AMBULATORY_CARE_PROVIDER_SITE_OTHER): Payer: Medicaid Other | Admitting: Women's Health

## 2015-02-25 MED ORDER — MEDROXYPROGESTERONE ACETATE 150 MG/ML IM SUSP: 150.0000 mg | INTRAMUSCULAR | Status: DC

## 2015-02-25 NOTE — Progress Notes (Signed)
Patient ID: [name], female   DOB: March 21, 1995, 20 y.o.   MRN: [id number] Subjective:    [name] is a 20 y.o. G77P2002 Caucasian female who presents for a postpartum visit. She is 6 weeks postpartum following a spontaneous vaginal delivery at 39.0 gestational weeks. Anesthesia: epidural. I have fully reviewed the prenatal and intrapartum course. Postpartum course has been uncomplicated. Baby's course has been uncomplicated. Baby is feeding by bottle. Bleeding no bleeding, had period 1/16.  Bowel function is normal. Bladder function is normal. Patient is sexually active. Last sexual activity: ~02/17/15. Contraception method is condoms and wants depo- but wants it when her next period starts to control bleeding better. Postpartum depression screening: negative. Score 0.  Last pap n/a <21yo.  The following portions of the patient's history were reviewed and updated as appropriate: allergies, current medications, past medical history, past surgical history and problem list.  Review of Systems Pertinent items are noted in HPI.   Filed Vitals:   02/25/15 1112  BP: 110/62  Pulse: 64  Weight: 142 lb (64.411 kg)   Patient's last menstrual period was 02/04/2015.  Objective:   General:  alert, cooperative and no distress   Breasts:  deferred, no complaints  Lungs: clear to auscultation bilaterally  Heart:  regular rate and rhythm  Abdomen: soft, nontender   Vulva: normal  Vagina: normal vagina  Cervix:  closed  Corpus: Well-involuted  Adnexa:  Non-palpable  Rectal Exam: No hemorrhoids        Assessment:   Postpartum exam 6 wks s/p SVB Bottlefeeding Depression screening Contraception counseling   Plan:   Contraception: rx depo w/ 3RF, call when period starts for 1st injection. Abstinence until then, then condoms x 2wks after 1st depo injection  Marge Duncans CNM, [facility] 02/25/2015 11:49 AM

## 2015-02-25 NOTE — Patient Instructions (Signed)
NO SEX UNTIL AFTER YOU GET YOUR BIRTH CONTROL  Call [place] when your period starts so you can get your first injection Condoms for 2 weeks after you get your first shot  Medroxyprogesterone injection [Contraceptive] What is this medicine? MEDROXYPROGESTERONE (me DROX ee proe JES te rone) contraceptive injections prevent pregnancy. They provide effective birth control for 3 months. Depo-subQ Provera 104 is also used for treating pain related to endometriosis. This medicine may be used for other purposes; ask your health care provider or pharmacist if you have questions. What should I tell my health care provider before I take this medicine? They need to know if you have any of these conditions: -frequently drink alcohol -asthma -blood vessel disease or a history of a blood clot in the lungs or legs -bone disease such as osteoporosis -breast cancer -diabetes -eating disorder (anorexia nervosa or bulimia) -high blood pressure -HIV infection or AIDS -kidney disease -liver disease -mental depression -migraine -seizures (convulsions) -stroke -tobacco smoker -vaginal bleeding -an unusual or allergic reaction to medroxyprogesterone, other hormones, medicines, foods, dyes, or preservatives -pregnant or trying to get pregnant -breast-feeding How should I use this medicine? Depo-Provera Contraceptive injection is given into a muscle. Depo-subQ Provera 104 injection is given under the skin. These injections are given by a health care professional. You must not be pregnant before getting an injection. The injection is usually given during the first 5 days after the start of a menstrual period or 6 weeks after delivery of a baby. Talk to your pediatrician regarding the use of this medicine in children. Special care may be needed. These injections have been used in female children who have started having menstrual periods. Overdosage: If you think you have taken too much of this medicine contact a  poison control center or emergency room at once. NOTE: This medicine is only for you. Do not share this medicine with others. What if I miss a dose? Try not to miss a dose. You must get an injection once every 3 months to maintain birth control. If you cannot keep an appointment, call and reschedule it. If you wait longer than 13 weeks between Depo-Provera contraceptive injections or longer than 14 weeks between Depo-subQ Provera 104 injections, you could get pregnant. Use another method for birth control if you miss your appointment. You may also need a pregnancy test before receiving another injection. What may interact with this medicine? Do not take this medicine with any of the following medications: -bosentan This medicine may also interact with the following medications: -aminoglutethimide -antibiotics or medicines for infections, especially rifampin, rifabutin, rifapentine, and griseofulvin -aprepitant -barbiturate medicines such as phenobarbital or primidone -bexarotene -carbamazepine -medicines for seizures like ethotoin, felbamate, oxcarbazepine, phenytoin, topiramate -modafinil -St. John's wort This list may not describe all possible interactions. Give your health care provider a list of all the medicines, herbs, non-prescription drugs, or dietary supplements you use. Also tell them if you smoke, drink alcohol, or use illegal drugs. Some items may interact with your medicine. What should I watch for while using this medicine? This drug does not protect you against HIV infection (AIDS) or other sexually transmitted diseases. Use of this product may cause you to lose calcium from your bones. Loss of calcium may cause weak bones (osteoporosis). Only use this product for more than 2 years if other forms of birth control are not right for you. The longer you use this product for birth control the more likely you will be at risk for weak bones.  Ask your health care professional how you can  keep strong bones. You may have a change in bleeding pattern or irregular periods. Many females stop having periods while taking this drug. If you have received your injections on time, your chance of being pregnant is very low. If you think you may be pregnant, see your health care professional as soon as possible. Tell your health care professional if you want to get pregnant within the next year. The effect of this medicine may last a long time after you get your last injection. What side effects may I notice from receiving this medicine? Side effects that you should report to your doctor or health care professional as soon as possible: -allergic reactions like skin rash, itching or hives, swelling of the face, lips, or tongue -breast tenderness or discharge -breathing problems -changes in vision -depression -feeling faint or lightheaded, falls -fever -pain in the abdomen, chest, groin, or leg -problems with balance, talking, walking -unusually weak or tired -yellowing of the eyes or skin Side effects that usually do not require medical attention (report to your doctor or health care professional if they continue or are bothersome): -acne -fluid retention and swelling -headache -irregular periods, spotting, or absent periods -temporary pain, itching, or skin reaction at site where injected -weight gain This list may not describe all possible side effects. Call your doctor for medical advice about side effects. You may report side effects to FDA at 1-800-FDA-1088. Where should I keep my medicine? This does not apply. The injection will be given to you by a health care professional. NOTE: This sheet is a summary. It may not cover all possible information. If you have questions about this medicine, talk to your doctor, pharmacist, or health care provider.    2016, Elsevier/Gold Standard. (2008-01-27 18:37:56)

## 2016-01-01 ENCOUNTER — Encounter: Payer: Self-pay | Admitting: Women's Health

## 2016-01-01 ENCOUNTER — Ambulatory Visit (INDEPENDENT_AMBULATORY_CARE_PROVIDER_SITE_OTHER): Payer: Medicaid Other | Admitting: Women's Health

## 2016-01-01 VITALS — BP 130/70 | HR 76 | Ht 65.0 in | Wt 139.0 lb

## 2016-01-01 DIAGNOSIS — N6321 Unspecified lump in the left breast, upper outer quadrant: Secondary | ICD-10-CM | POA: Diagnosis not present

## 2016-01-01 DIAGNOSIS — N644 Mastodynia: Secondary | ICD-10-CM

## 2016-01-01 DIAGNOSIS — N6320 Unspecified lump in the left breast, unspecified quadrant: Secondary | ICD-10-CM

## 2016-01-01 NOTE — Patient Instructions (Signed)
Breast ultrasound Tues 1/2 @ 11:40pm at [facility], be there at 11:20, no lotion/deoderant/powder/perfume that day

## 2016-01-01 NOTE — Progress Notes (Signed)
   [facility]  Patient name: [name] MRN [id number]  Date of birth: 09/06/1995  CC & HPI:  [name] is a 20 y.o. [id number] Caucasian female presenting today for Lt breast pain x 1 month. Comes and goes throughout the day, sharp shooting pain Lt lateral breast. No abnormal nipple d/c, she feels like that area feels 'different' when palpating. Does not drink any caffeine, does not smoke. Daughter does lay on that side and sometimes elbow goes into that area.  No LMP recorded. The current method of family planning is Depo-Provera injections. Last pap <21yo  Pertinent History Reviewed:  Medical & Surgical Hx:   Past medical, surgical, family, and social history reviewed in electronic medical record Medications: Reviewed & Updated - see associated section Allergies: Reviewed in electronic medical record  Objective Findings:  Vitals: BP 130/70   Pulse 76   Ht 5\' 5"  (1.651 m)   Wt 139 lb (63 kg)   BMI 23.13 kg/m  Body mass index is 23.13 kg/m.  Physical Examination: General appearance - alert, well appearing, and in no distress Breasts - Rt breast appears normal, no suspicious masses, no skin or nipple changes or axillary nodes Lt: area of pain Lt outer upper breast- thickened area of tissue in this area, no pain w/ palpation  No results found for this or any previous visit (from the past 24 hour(s)).   Assessment & Plan:  A:   Lt breast mastalgia  Lt breast thickening/mas  P:  Breast u/s at [facility] 1[facility] @ 11:40, be there @ 11:20, no lotion/powder/deoderant/perfume  Return for when turn 21yo for pap.  Marge DuncansBooker, [name] CNM, [facility] 01/01/2016 11:16 AM

## 2016-01-21 ENCOUNTER — Ambulatory Visit (HOSPITAL_COMMUNITY)
Admission: RE | Admit: 2016-01-21 | Discharge: 2016-01-21 | Disposition: A | Discharge status: Home / Self Care | Payer: Medicaid Other | Source: Ambulatory Visit | Attending: Women's Health | Admitting: Women's Health

## 2016-01-21 ENCOUNTER — Other Ambulatory Visit (HOSPITAL_COMMUNITY): Payer: Self-pay | Admitting: Surgery

## 2016-01-21 DIAGNOSIS — N644 Mastodynia: Secondary | ICD-10-CM | POA: Insufficient documentation

## 2016-01-21 DIAGNOSIS — N6320 Unspecified lump in the left breast, unspecified quadrant: Secondary | ICD-10-CM

## 2016-01-21 DIAGNOSIS — N6321 Unspecified lump in the left breast, upper outer quadrant: Secondary | ICD-10-CM | POA: Diagnosis not present

## 2016-04-07 ENCOUNTER — Other Ambulatory Visit: Payer: Self-pay | Admitting: Women's Health

## 2016-06-08 ENCOUNTER — Ambulatory Visit (INDEPENDENT_AMBULATORY_CARE_PROVIDER_SITE_OTHER): Payer: Medicaid Other | Admitting: Adult Health

## 2016-06-08 ENCOUNTER — Encounter: Payer: Self-pay | Admitting: Adult Health

## 2016-06-08 VITALS — BP 92/60 | HR 90 | Ht 66.0 in | Wt 139.0 lb

## 2016-06-08 DIAGNOSIS — Z3202 Encounter for pregnancy test, result negative: Secondary | ICD-10-CM | POA: Diagnosis not present

## 2016-06-08 DIAGNOSIS — N898 Other specified noninflammatory disorders of vagina: Secondary | ICD-10-CM

## 2016-06-08 DIAGNOSIS — R1032 Left lower quadrant pain: Secondary | ICD-10-CM | POA: Diagnosis not present

## 2016-06-08 DIAGNOSIS — R319 Hematuria, unspecified: Secondary | ICD-10-CM

## 2016-06-08 LAB — POCT WET PREP (WET MOUNT)
CLUE CELLS WET PREP WHIFF POC: NEGATIVE
WBC, Wet Prep HPF POC: POSITIVE

## 2016-06-08 LAB — POCT URINALYSIS DIPSTICK
Glucose, UA: NEGATIVE
KETONES UA: NEGATIVE
Nitrite, UA: NEGATIVE
PROTEIN UA: NEGATIVE

## 2016-06-08 LAB — POCT URINE PREGNANCY: PREG TEST UR: NEGATIVE

## 2016-06-08 MED ORDER — METRONIDAZOLE 500 MG PO TABS: 500.0000 mg | ORAL_TABLET | Freq: Two times a day (BID) | ORAL | 0 refills | Status: DC

## 2016-06-08 MED ORDER — DOXYCYCLINE HYCLATE 100 MG PO CAPS: 100.0000 mg | ORAL_CAPSULE | Freq: Two times a day (BID) | ORAL | 0 refills | Status: DC

## 2016-06-08 NOTE — Progress Notes (Signed)
Subjective:     Patient ID: [name], female   DOB: 1996/01/13, 20 y.o.   MRN: [id number]  HPI [name] is a 21 year old white female in complaining of pain in left side for 2 weeks and bloating and it got worse yesterday and [facility] but left before seen.has some nausea and constipation at times, no diarrhea.She is on Depo provera. No fever  Review of Systems Pain in left side for 2 weeks Nausea Constipation at times, no diarrhea,no fever   Reviewed past medical,surgical, social and family history. Reviewed medications and allergies.     Objective:   Physical Exam BP 92/60   Pulse 90   Ht 5\' 6"  (1.676 m)   Wt 139 lb (63 kg)   LMP  (LMP Unknown)   BMI 22.44 kg/m urine dipstick trace blood,UPT negative. Skin warm and dry.Pelvic: external genitalia is normal in appearance no lesions, vagina: tan discharge without odor,urethra has no lesions or masses noted, cervix:smooth and bulbous, uterus: normal size, shape and contour, mildly tender, no masses felt, adnexa: no masses, LLQ  tenderness noted. Bladder is non tender and no masses felt. Wet prep: + for  Few clue cells and +WBCs.No upper abdominal pain or HSM. GC/CHL obtained.  Will treat with flagyl and doxycycline and get US this and request, labs from ER.    Assessment:     1. LLQ pain   2. Hematuria, unspecified type   3. Vaginal discharge   4. Pregnancy examination or test, negative result       Plan:    GC/CHL sent  UA C&S sent on urine  Meds ordered this encounter  Medications  . metroNIDAZOLE (FLAGYL) 500 MG tablet    Sig: Take 1 tablet (500 mg total) by mouth 2 (two) times daily.    Dispense:  14 tablet    Refill:  0    Order Specific Question:   Supervising Provider    Answer:   Despina HiddenEURE, LUTHER H [[id number]]  . doxycycline (VIBRAMYCIN) 100 MG capsule    Sig: Take 1 capsule (100 mg total) by mouth 2 (two) times daily.    Dispense:  17 capsule    Refill:  0    Order Specific Question:    Supervising Provider    Answer:   Lazaro ArmsEURE, LUTHER H [[id number]]  Ok to use tylenol or advil for pain F/U in 2 days for GYN [place]

## 2016-06-09 LAB — MICROSCOPIC EXAMINATION
BACTERIA UA: NONE SEEN
Casts: NONE SEEN /lpf

## 2016-06-09 LAB — URINALYSIS, ROUTINE W REFLEX MICROSCOPIC
BILIRUBIN UA: NEGATIVE
Glucose, UA: NEGATIVE
KETONES UA: NEGATIVE
Nitrite, UA: NEGATIVE
PROTEIN UA: NEGATIVE
RBC, UA: NEGATIVE
SPEC GRAV UA: 1.018 (ref 1.005–1.030)
UUROB: 0.2 mg/dL (ref 0.2–1.0)
pH, UA: 5.5 (ref 5.0–7.5)

## 2016-06-09 LAB — GC/CHLAMYDIA PROBE AMP
Chlamydia trachomatis, NAA: NEGATIVE
Neisseria gonorrhoeae by PCR: NEGATIVE

## 2016-06-10 ENCOUNTER — Ambulatory Visit (INDEPENDENT_AMBULATORY_CARE_PROVIDER_SITE_OTHER): Payer: Medicaid Other

## 2016-06-10 DIAGNOSIS — R1032 Left lower quadrant pain: Secondary | ICD-10-CM

## 2016-06-10 LAB — URINE CULTURE

## 2016-06-10 NOTE — Progress Notes (Signed)
PELVIC US TA/TV: homogeneous anteverted uterus,wnl,EEC 6.8 mm,normal ovaries bilat,no free fluid,ovaries appear mobile,LLQ pain during T/A scan

## 2016-06-11 ENCOUNTER — Telehealth: Payer: Self-pay | Admitting: Adult Health

## 2016-06-11 NOTE — Telephone Encounter (Signed)
Pt aware that US was normal, uterus and ovaries all normal, and GC/CHL negative and urine had no growth, so symptoms may be bowel related.

## 2016-06-12 ENCOUNTER — Telehealth: Payer: Self-pay | Admitting: Adult Health

## 2016-06-12 DIAGNOSIS — K5900 Constipation, unspecified: Secondary | ICD-10-CM

## 2016-06-12 DIAGNOSIS — R1032 Left lower quadrant pain: Secondary | ICD-10-CM

## 2016-06-12 NOTE — Telephone Encounter (Signed)
[facility] I advised pt that Victorino DikeJennifer was out of the office until June 4th. Pt verbalized understanding.

## 2016-06-22 NOTE — Telephone Encounter (Signed)
[facility]

## 2016-06-23 ENCOUNTER — Encounter: Payer: Self-pay | Admitting: Gastroenterology

## 2016-07-30 ENCOUNTER — Ambulatory Visit (INDEPENDENT_AMBULATORY_CARE_PROVIDER_SITE_OTHER): Payer: Medicaid Other | Admitting: Gastroenterology

## 2016-07-30 ENCOUNTER — Encounter: Payer: Self-pay | Admitting: Gastroenterology

## 2016-07-30 DIAGNOSIS — R1032 Left lower quadrant pain: Secondary | ICD-10-CM | POA: Diagnosis not present

## 2016-07-30 NOTE — Progress Notes (Signed)
cc'ed to pcp °

## 2016-07-30 NOTE — Patient Instructions (Signed)
I have ordered blood work to check for celiac disease.  Let me know if the pain recurs. We will do a CT scan at that point.  I will see you in 6 months!  Keep up the great work!

## 2016-07-30 NOTE — Assessment & Plan Note (Signed)
21 year old female with several month history of LLQ pain that has significantly improved and nearly resolved since cessation of breads, pasta, noodles, etc. Notes significant bloating with intake of these food items, with improvement since elimination from diet. No constipation. Will check celiac labs, have her return in 6 months, continue avoidance of these foods, and proceed with CT if recurrent pain. No indication for colonoscopy at this time. Pelvic ultrasound on file and normal.

## 2016-07-30 NOTE — Progress Notes (Signed)
Primary Care Physician:  Jonita Albee, [facility]ologist:  Dr. Darrick Penna   Chief Complaint  Patient presents with  . Abdominal Pain    HPI:   [name] is a 21 y.o. female presenting today at the request of Cyril Mourning, NP, secondary to abdominal pain. She is present with her 2 young children, age 77 and 1.5. Had LLQ pain starting around May. Saw Cyril Mourning, NP, had pelvic ultrasound. Was worried about an ectopic pregnancy. This was normal.    Cut out breads, noodles, soy. Was eating a lot of carbs and was having LLQ pain. Stomach felt full and bloated all the time. Felt like it was not digesting food. Had intermittent constipation. Has always had issues with chronic constipation. Stool can be "weird colors". Had seen Dr. Loney Hering in the past for evaluation of abdominal pain. Doesn't feel bloated or with pain any longer. If eats bread, will have either constipation or diarrhea. Sometimes rare nausea. No significant reflux symptoms.   States when she was younger, she would throw up every morning. Eats lots of chicken and fish, salmon, cauliflower rice, veggies. No potatoes or bread. BM every day, no straining. Has lost weight since stopping carbs. Doesn't feel as bloated since dietary changes. LLQ pain resolved. Outside labs scanned into epic without anemia. No rectal bleeding.   Past Medical History:  Diagnosis Date  . Anxiety   . Asthma   . Chlamydia infection   . Depression 2016  . Eczema   . Medical history non-contributory   . Pregnant   . Pregnant 06/13/2014  . Scoliosis     Past Surgical History:  Procedure Laterality Date  . TONSILLECTOMY AND ADENOIDECTOMY    . TONSILLECTOMY AND ADENOIDECTOMY      Current Outpatient Prescriptions  Medication Sig Dispense Refill  . ALBUTEROL SULFATE HFA IN Inhale into the lungs as needed. Reported on 01/02/2015    . medroxyPROGESTERone (DEPO-PROVERA) 150 MG/ML injection INJECT INTO THE MUSCLE EVERY 3  MONTHS 1 mL 3   No current facility-administered medications for this visit.     Allergies as of 07/30/2016 - Review Complete 07/30/2016  Allergen Reaction Noted  . Penicillins  10/17/2012    Family History  Problem Relation Age of Onset  . Hypertension Father   . Depression Father   . Vision loss Father   . Asthma Paternal Aunt   . Thyroid disease Paternal Aunt   . Hyperlipidemia Maternal Grandfather   . Heart disease Paternal Grandmother   . Hypertension Paternal Grandmother   . Diabetes Paternal Grandfather   . Hyperlipidemia Paternal Grandfather   . Asthma Paternal Aunt   . Colon cancer Neg Hx   . Colon polyps Neg Hx     Social History   Social History  . Marital status: Married    Spouse name: N/A  . Number of children: N/A  . Years of education: N/A   Occupational History  . 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 Smoking status: Never Smoker  . Smokeless tobacco: Never Used  . Alcohol use No  . Drug use: No  . Sexual activity: Yes    Birth control/ protection: None, Condom, Injection   Other Topics Concern  . Not on file   Social History Narrative  . No narrative on file    Review of Systems: As mentioned in HPI   Physical Exam: BP 115/74  Pulse 88   Temp (!) 97.4 F (36.3 C) (Oral)   Ht 5\' 5"  (1.651 m)   Wt 132 lb 6.4 oz (60.1 kg)   BMI 22.03 kg/m  General:   Alert and oriented. Pleasant and cooperative. Well-nourished and well-developed.  Head:  Normocephalic and atraumatic. Eyes:  Without icterus, sclera clear and conjunctiva pink.  Ears:  Normal auditory acuity. Nose:  No deformity, discharge,  or lesions. Mouth:  No deformity or lesions, oral mucosa pink.  Lungs:  Clear to auscultation bilaterally. No wheezes, rales, or rhonchi. No distress.  Heart:  S1, S2 present without murmurs appreciated.  Abdomen:  +BS, soft, very mild TTP LLQ only with palpation and  non-distended. No HSM noted. No guarding or rebound. No masses appreciated.  Rectal:  Deferred  Msk:  Symmetrical without gross deformities. Normal posture. Extremities:  Without  edema. Neurologic:  Alert and  oriented x4 Psych:  Alert and cooperative. Normal mood and affect.

## 2016-07-31 LAB — TISSUE TRANSGLUTAMINASE, IGA

## 2016-07-31 LAB — IGA: IgA/Immunoglobulin A, Serum: 115 mg/dL (ref 87–352)

## 2016-08-01 ENCOUNTER — Encounter: Payer: Self-pay | Admitting: Gastroenterology

## 2016-08-11 NOTE — Progress Notes (Signed)
Noted  

## 2016-08-11 NOTE — Progress Notes (Signed)
Negative celiac serologies. Message sent in Mychart.

## 2016-08-23 NOTE — Progress Notes (Signed)
REVIEWED-NO ADDITIONAL RECOMMENDATIONS. 

## 2016-12-30 ENCOUNTER — Encounter: Payer: Self-pay | Admitting: Gastroenterology

## 2017-02-01 ENCOUNTER — Ambulatory Visit: Payer: Self-pay | Admitting: Adult Health

## 2017-02-05 ENCOUNTER — Ambulatory Visit (INDEPENDENT_AMBULATORY_CARE_PROVIDER_SITE_OTHER): Payer: Medicaid Other | Admitting: Adult Health

## 2017-02-05 ENCOUNTER — Encounter: Payer: Self-pay | Admitting: Adult Health

## 2017-02-05 ENCOUNTER — Other Ambulatory Visit (HOSPITAL_COMMUNITY)
Admission: RE | Admit: 2017-02-05 | Discharge: 2017-02-05 | Disposition: A | Discharge status: Home / Self Care | Payer: Self-pay | Source: Ambulatory Visit | Attending: Adult Health | Admitting: Adult Health

## 2017-02-05 VITALS — BP 104/70 | HR 87 | Ht 65.0 in | Wt 139.0 lb

## 2017-02-05 DIAGNOSIS — Z01419 Encounter for gynecological examination (general) (routine) without abnormal findings: Secondary | ICD-10-CM

## 2017-02-05 DIAGNOSIS — Z113 Encounter for screening for infections with a predominantly sexual mode of transmission: Secondary | ICD-10-CM

## 2017-02-05 DIAGNOSIS — Z30011 Encounter for initial prescription of contraceptive pills: Secondary | ICD-10-CM | POA: Insufficient documentation

## 2017-02-05 DIAGNOSIS — Z309 Encounter for contraceptive management, unspecified: Secondary | ICD-10-CM | POA: Diagnosis not present

## 2017-02-05 DIAGNOSIS — Z3202 Encounter for pregnancy test, result negative: Secondary | ICD-10-CM | POA: Diagnosis not present

## 2017-02-05 DIAGNOSIS — Z3009 Encounter for other general counseling and advice on contraception: Secondary | ICD-10-CM | POA: Insufficient documentation

## 2017-02-05 LAB — POCT URINE PREGNANCY: PREG TEST UR: NEGATIVE

## 2017-02-05 MED ORDER — NORETHIN-ETH ESTRAD-FE BIPHAS 1 MG-10 MCG / 10 MCG PO TABS: 1.0000 | ORAL_TABLET | Freq: Every day | ORAL | 11 refills | Status: DC

## 2017-02-05 NOTE — Patient Instructions (Signed)

## 2017-02-05 NOTE — Progress Notes (Signed)
Patient ID: Tobias Alexanderestinie D Robson, female   DOB: 1995-03-10, 22 y.o.   MRN: [id number] History of Present Illness: Lillyanne is a 22 year old white female, married, G4 P2 in for well woman gyn exam and pap, and get on birth control. [facility], now and was due depo in December but did not get.    Current Medications, Allergies, Past Medical History, Past Surgical History, Family History and Social History were [facility].     Review of Systems:  Patient denies any headaches, hearing loss, fatigue, blurred vision, shortness of breath, chest pain, abdominal pain, problems with bowel movements, urination, or intercourse. No joint pain or mood swings.   Physical Exam: BP 104/70 (BP Location: Left Arm, Patient Position: Sitting, Cuff Size: Normal)   Pulse 87   Ht 5\' 5"  (1.651 m)   Wt 139 lb (63 kg)   LMP 01/31/2017   BMI 23.13 kg/m UPT negative. General:  Well developed, well nourished, no acute distress Skin:  Warm and dry Neck:  Midline trachea, normal thyroid, good ROM, no lymphadenopathy Lungs; Clear to auscultation bilaterally Breast:  No dominant palpable mass, retraction, or nipple discharge Cardiovascular: Regular rate and rhythm Abdomen:  Soft, non tender, no hepatosplenomegaly Pelvic:  External genitalia is normal in appearance, no lesions.  The vagina is normal in appearance. Urethra has no lesions or masses. The cervix is bulbous. Pap with GC/CHL performed. Uterus is felt to be normal size, shape, and contour.  No adnexal masses or tenderness noted.Bladder is non tender, no masses felt. Extremities/musculoskeletal:  No swelling or varicosities noted, no clubbing or cyanosis Psych:  No mood changes, alert and cooperative,seems happy PHQ 2 score 0.Will start OCs for now, but may want IUD or nexplanon as option.   Impression: 1. Encounter for gynecological examination with Papanicolaou smear of cervix   2. Family planning    3. Screening examination for STD (sexually transmitted disease)   4. Encounter for initial prescription of contraceptive pills       Plan: Check HIV and RPR Meds ordered this encounter  Medications  . Norethindrone-Ethinyl Estradiol-Fe Biphas (LO LOESTRIN FE) 1 MG-10 MCG / 10 MCG tablet    Sig: Take 1 tablet by mouth daily. Take 1 daily by mouth    Dispense:  1 Package    Refill:  11    BIN [id number], PCN [id number], GRP [id number],ID [id number]    Order Specific Question:   Supervising Provider    Answer:   Lazaro ArmsEURE, LUTHER H [[id number]]  Review handout on mirena IUD and nexplanon Physical in 1 year Pap in 3 if normal Start OCs now or Sunday and use condoms for 1 month, and if wants IUD or nexplanon, call back

## 2017-02-06 LAB — HIV ANTIBODY (ROUTINE TESTING W REFLEX): HIV Screen 4th Generation wRfx: NONREACTIVE

## 2017-02-06 LAB — RPR: RPR: NONREACTIVE

## 2017-02-08 LAB — CYTOLOGY - PAP
CHLAMYDIA, DNA PROBE: NEGATIVE
Diagnosis: NEGATIVE
NEISSERIA GONORRHEA: NEGATIVE

## 2017-03-16 ENCOUNTER — Other Ambulatory Visit: Payer: Self-pay | Admitting: Women's Health

## 2017-04-14 ENCOUNTER — Encounter: Payer: Self-pay | Admitting: *Deleted

## 2017-04-14 ENCOUNTER — Telehealth: Payer: Self-pay | Admitting: Advanced Practice Midwife

## 2017-04-14 NOTE — Telephone Encounter (Signed)
LMOVM returning her call.

## 2017-04-14 NOTE — Telephone Encounter (Signed)
Mychart message sent.

## 2017-06-10 IMAGING — US ULTRASOUND LEFT BREAST LIMITED
1 series · 4 of 4 positions shown · non-contrast
Comparison: Previous exam(s).

CLINICAL DATA: Pain and lumps in the upper outer left breast felt
by the patient and her physician.

EXAM:
ULTRASOUND OF THE LEFT BREAST

[Series 1: ultrasound left breast limited · 0.06mm/px · 4 of 4 slices shown]
[im 1/4]
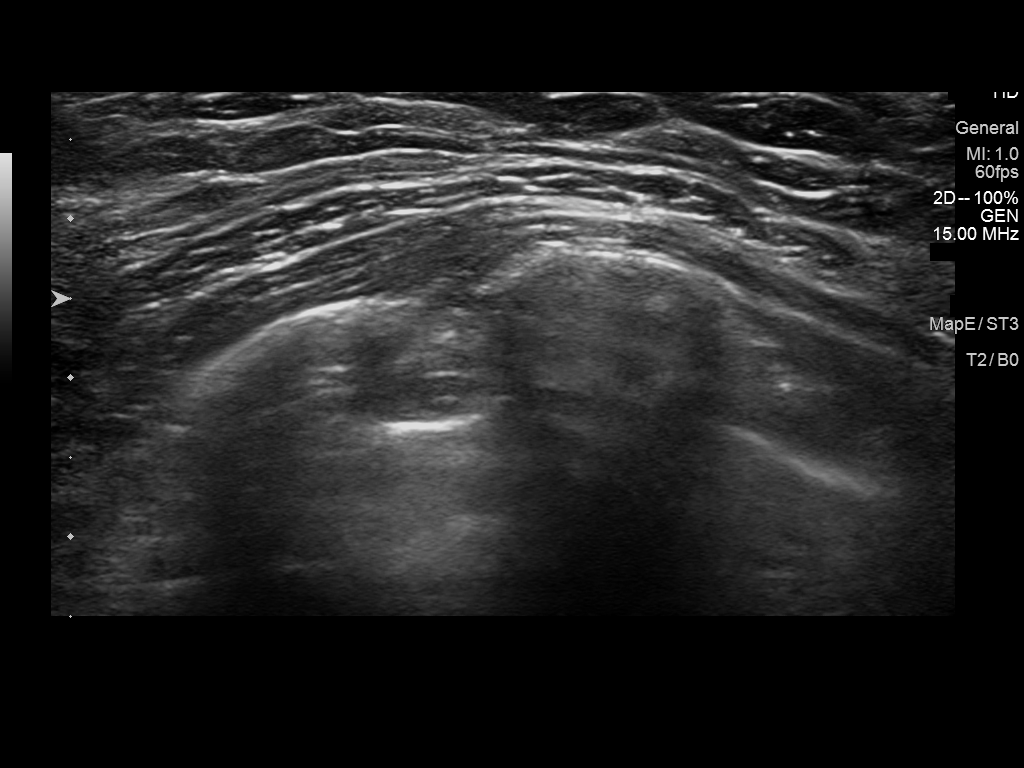
[im 2/4]
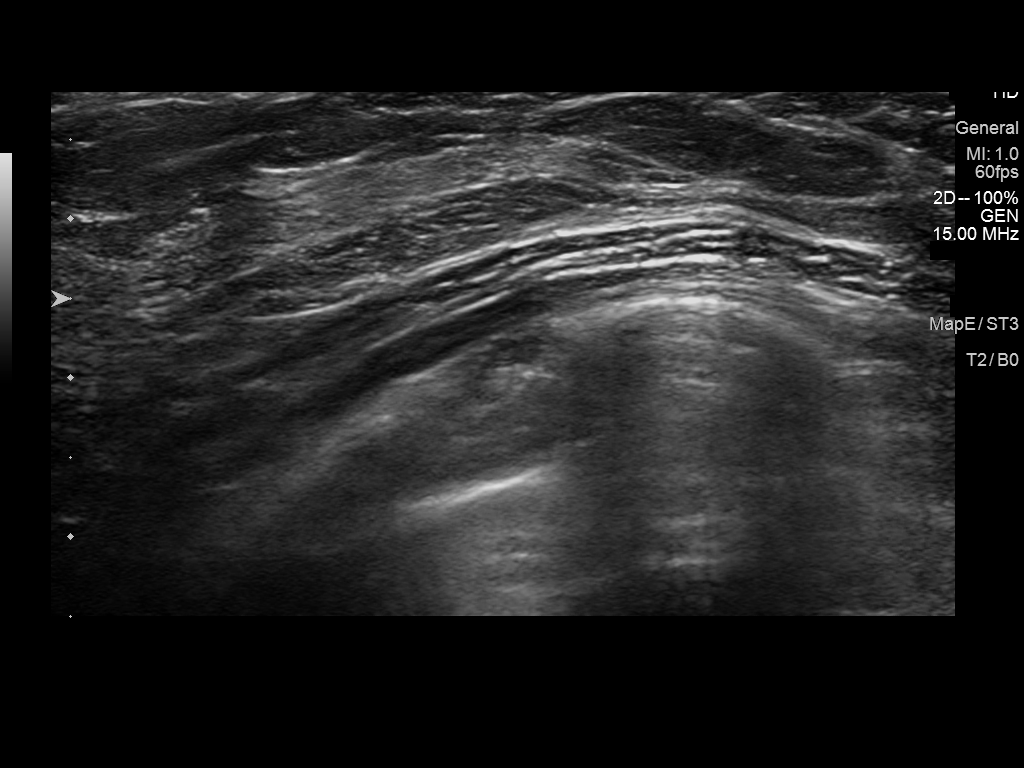
[im 3/4]
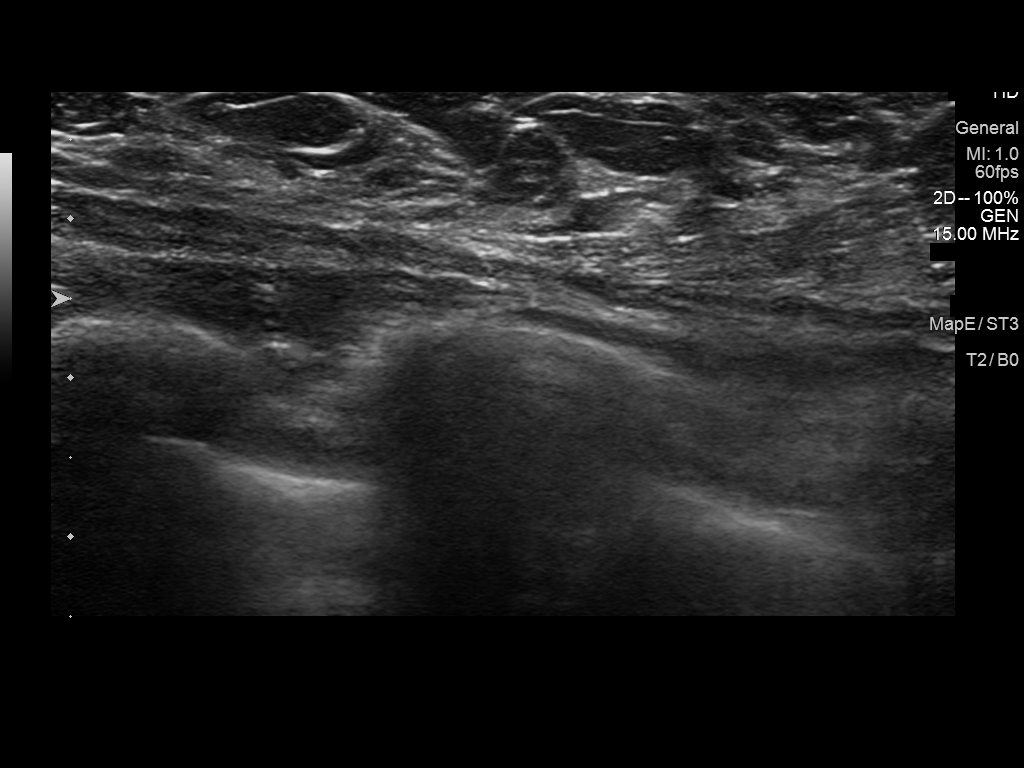
[im 4/4]
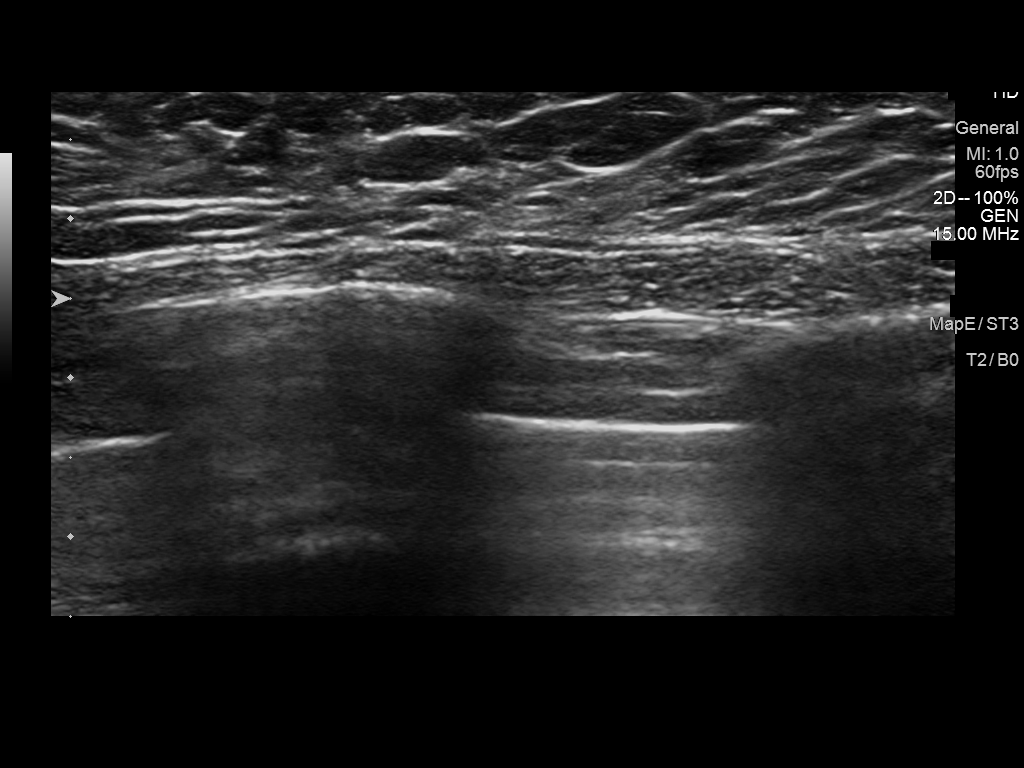

[4 of 4 positions shown; findings below may reference images not displayed]

FINDINGS: On physical exam, no suspicious lumps identified.

Targeted ultrasound is performed, showing no sonographic correlate
to the patient's symptoms.
IMPRESSION: No sonographic evidence of malignancy.

RECOMMENDATION:
Treatment of the patient's symptoms should be based on clinical and
physical exam given the lack of imaging findings. Recommend annual
screening mammography beginning at the age of 40.

I have discussed the findings and recommendations with the patient.
Results were also provided in writing at the conclusion of the
visit. If applicable, a reminder letter will be sent to the patient
regarding the next appointment.

BI-RADS CATEGORY  2: Benign.

## 2018-03-07 ENCOUNTER — Other Ambulatory Visit: Payer: Self-pay | Admitting: Adult Health

## 2018-09-28 ENCOUNTER — Other Ambulatory Visit: Payer: Self-pay

## 2018-09-28 ENCOUNTER — Ambulatory Visit: Payer: Medicaid Other | Admitting: Neurology

## 2018-09-28 ENCOUNTER — Encounter: Payer: Self-pay | Admitting: Neurology

## 2018-09-28 VITALS — BP 112/78 | HR 93 | Temp 98.2°F | Ht 65.0 in | Wt 142.5 lb

## 2018-09-28 DIAGNOSIS — IMO0002 Reserved for concepts with insufficient information to code with codable children: Secondary | ICD-10-CM

## 2018-09-28 DIAGNOSIS — G43709 Chronic migraine without aura, not intractable, without status migrainosus: Secondary | ICD-10-CM | POA: Diagnosis not present

## 2018-09-28 MED ORDER — NORTRIPTYLINE HCL 10 MG PO CAPS: 20.0000 mg | ORAL_CAPSULE | Freq: Every day | ORAL | 11 refills | Status: DC

## 2018-09-28 MED ORDER — SUMATRIPTAN SUCCINATE 50 MG PO TABS: 50.0000 mg | ORAL_TABLET | ORAL | 11 refills | Status: DC | PRN

## 2018-09-28 NOTE — Progress Notes (Signed)
PATIENT: [name] DOB: 1995-04-09  Chief Complaint  Patient presents with  . Headaches/Tinnitus    Reports having 3-4 headache days per week, mostly right-sided pain.  Also, she is concerned about ringing in her ears, louder in right.  She is using Azelastine HCL 0.1% nasal spray and Tylenol every day.  . ENT    Graylin Shiver, MD - referring MD  . PCP    Ladora Daniel, PA-C     HISTORICAL  [name] is a 23 years old female seen in request by her ENT Dr. Billy Fischer and primary care PA Ladora Daniel for evaluation of frequent headaches, right-sided tinnitus, initial evaluation was on September 28, 2018.  I have reviewed and summarized the referring note from the referring physician.  She reported history of intermittent bilateral headaches since high school, it used to be a bilateral frontal retro-orbital region, since 2019, she began to notice increased headaches, especially since June 2020, her headache become more frequent, 5 times each week, usually is located at right occipital, parietal temporal region, retro-orbital, with associated light noise sensitivity, movement made it worse, lying dark resting for couple hours will help her headaches, Tylenol as needed is helpful most of the time, she has very busy schedule, is a mother of 19 and 72 years old, also attend [employer] school, work part-time  She also began to complains of worsening tinnitus, was evaluated by ENT, There was a concern of dysfunction of bilateral eustachian tubes, she was also referred to [facility] tinnitus clinic, was given Flonase nasal spray, with limited help of her tinnitus,   REVIEW OF SYSTEMS: Full 14 system review of systems performed and notable only for as above All other review of systems were negative.  ALLERGIES: Allergies  Allergen Reactions  . Penicillins     Unsure of reaction.Has patient had a PCN reaction causing immediate rash, facial/tongue/throat swelling, SOB or lightheadedness  with hypotension unknown Has patient had a PCN reaction causing severe rash involving mucus membranes or skin necrosis: No Has patient had a PCN reaction that required hospitalization No Has patient had a PCN reaction occurring within the last 10 years:no If all of the above answers are "NO", then may proceed with Cephalosporin use.    HOME MEDICATIONS: Current Outpatient Medications  Medication Sig Dispense Refill  . acetaminophen (TYLENOL) 325 MG tablet Take 650 mg by mouth every 6 (six) hours as needed.    . ALBUTEROL SULFATE HFA IN Inhale into the lungs as needed. Reported on 01/02/2015    . azelastine (ASTELIN) 0.1 % nasal spray USE 1 SPRAY(S) IN EACH NOSTRIL TWICE DAILY AS DIRECTED    . levonorgestrel-ethinyl estradiol (ALESSE) 0.1-20 MG-MCG tablet Take by mouth.    . loratadine (CLARITIN) 10 MG tablet Take 10 mg by mouth daily.     No current facility-administered medications for this visit.     PAST MEDICAL HISTORY: Past Medical History:  Diagnosis Date  . Anxiety   . Asthma   . Chlamydia infection   . Depression 2016  . Eczema   . Headache   . Medical history non-contributory   . Pregnant   . Pregnant 06/13/2014  . Scoliosis   . Tinnitus     PAST SURGICAL HISTORY: Past Surgical History:  Procedure Laterality Date  . TONSILLECTOMY AND ADENOIDECTOMY      FAMILY HISTORY: Family History  Problem Relation Age of Onset  . Hypertension Father   . Depression Father   . Vision  loss Father   . Asthma Paternal Aunt   . Thyroid disease Paternal Aunt   . Hyperlipidemia Maternal Grandfather   . Heart disease Paternal Grandmother   . Hypertension Paternal Grandmother   . Diabetes Paternal Grandfather   . Hyperlipidemia Paternal Grandfather   . Asthma Paternal Aunt   . Seizures Mother   . Other Mother        history of drug abuse  . Colon cancer Neg Hx   . Colon polyps Neg Hx     SOCIAL HISTORY: Social History   Socioeconomic History  . Marital status:  Married    Spouse name: Not on file  . Number of children: 2  . Years of education: in college  . Highest education level: Not on file  Occupational History  . Occupation: full-time Ship broker  . Occupation: Environmental consultant - office job  . Occupation: help w/ teenage pregnancy program  Social Needs  . Financial resource strain: Not on file  . Food insecurity    Worry: Not on file    Inability: Not on file  . Transportation needs    Medical: Not on file    Non-medical: Not on file  Tobacco Use  . Smoking status: Never Smoker  . Smokeless tobacco: Never Used  Substance and Sexual Activity  . Alcohol use: No  . Drug use: No  . Sexual activity: Yes    Birth control/protection: Condom, Injection  Lifestyle  . Physical activity    Days per week: Not on file    Minutes per session: Not on file  . Stress: Not on file  Relationships  . Social Herbalist on phone: Not on file    Gets together: Not on file    Attends religious service: Not on file    Active member of club or organization: Not on file    Attends meetings of clubs or organizations: Not on file    Relationship status: Not on file  . Intimate partner violence    Fear of current or ex partner: Not on file    Emotionally abused: Not on file    Physically abused: Not on file    Forced sexual activity: Not on file  Other Topics Concern  . Not on file  Social History Narrative   Lives at home with husband and two children.     Right-handed.   Occasional use of caffeine.     PHYSICAL EXAM   Vitals:   09/28/18 0752  BP: 112/78  Pulse: 93  Temp: 98.2 F (36.8 C)  Weight: 142 lb 8 oz (64.6 kg)  Height: 5\' 5"  (1.651 m)    Not recorded      Body mass index is 23.71 kg/m.  PHYSICAL EXAMNIATION:  Gen: NAD, conversant, well nourised, well groomed                     Cardiovascular: Regular rate rhythm, no peripheral edema, warm, nontender. Eyes: Conjunctivae clear without exudates or hemorrhage Neck:  Supple, no carotid bruits. Pulmonary: Clear to auscultation bilaterally   NEUROLOGICAL EXAM:  MENTAL STATUS: Speech:    Speech is normal; fluent and spontaneous with normal comprehension.  Cognition:     Orientation to time, place and person     Normal recent and remote memory     Normal Attention span and concentration     Normal Language, naming, repeating,spontaneous speech     Fund of knowledge   CRANIAL NERVES: CN II:  Visual fields are full to confrontation.  Pupils are round equal and briskly reactive to light. CN III, IV, VI: extraocular movement are normal. No ptosis. CN V: Facial sensation is intact to pinprick in all 3 divisions bilaterally. Corneal responses are intact.  CN VII: Face is symmetric with normal eye closure and smile. CN VIII: Hearing is normal to rubbing fingers CN IX, X: Palate elevates symmetrically. Phonation is normal. CN XI: Head turning and shoulder shrug are intact CN XII: Tongue is midline with normal movements and no atrophy.  MOTOR: There is no pronator drift of out-stretched arms. Muscle bulk and tone are normal. Muscle strength is normal.  REFLEXES: Reflexes are 2+ and symmetric at the biceps, triceps, knees, and ankles. Plantar responses are flexor.  SENSORY: Intact to light touch, pinprick, positional sensation and vibratory sensation are intact in fingers and toes.  COORDINATION: Rapid alternating movements and fine finger movements are intact. There is no dysmetria on finger-to-nose and heel-knee-shin.    GAIT/STANCE: Posture is normal. Gait is steady with normal steps, base, arm swing, and turning. Heel and toe walking are normal. Tandem gait is normal.  Romberg is absent.   DIAGNOSTIC DATA (LABS, IMAGING, TESTING) - I reviewed patient records, labs, notes, testing and imaging myself where available.   ASSESSMENT AND PLAN  [name] is a 23 y.o. female   Chronic migraine headache  Start preventive medication  nortriptyline 10 mg titrating to 20 mg every night  Imitrex as needed, or Aleve as needed     Levert FeinsteinYijun Terren Jandreau, M.D. Ph.D.  [facility] [address], [address] [place], [place] [address] Ph: [phone] Fax: [phone]  CC: Graylin ShiverMarcellino, Amanda J, MD

## 2018-12-28 ENCOUNTER — Other Ambulatory Visit: Payer: Self-pay

## 2018-12-28 ENCOUNTER — Ambulatory Visit (INDEPENDENT_AMBULATORY_CARE_PROVIDER_SITE_OTHER): Payer: BC Managed Care – PPO | Admitting: Neurology

## 2018-12-28 ENCOUNTER — Encounter: Payer: Self-pay | Admitting: Neurology

## 2018-12-28 VITALS — BP 114/80 | HR 99 | Temp 97.0°F | Ht 65.5 in | Wt 147.8 lb

## 2018-12-28 DIAGNOSIS — G43709 Chronic migraine without aura, not intractable, without status migrainosus: Secondary | ICD-10-CM

## 2018-12-28 DIAGNOSIS — IMO0002 Reserved for concepts with insufficient information to code with codable children: Secondary | ICD-10-CM

## 2018-12-28 MED ORDER — TOPIRAMATE 25 MG PO TABS: ORAL_TABLET | ORAL | 11 refills | Status: DC

## 2018-12-28 NOTE — Progress Notes (Signed)
PATIENT: [name] DOB: 1995-05-27  REASON FOR VISIT: follow up HISTORY FROM: patient  HISTORY OF PRESENT ILLNESS: Today 12/28/18  HISTORY [name] is a 23 years old female seen in request by her ENT Dr. Lind Guest and primary care PA Nicholes Rough for evaluation of frequent headaches, right-sided tinnitus, initial evaluation was on September 28, 2018.  I have reviewed and summarized the referring note from the referring physician.  She reported history of intermittent bilateral headaches since high school, it used to be a bilateral frontal retro-orbital region, since 2019, she began to notice increased headaches, especially since June 2020, her headache become more frequent, 5 times each week, usually is located at right occipital, parietal temporal region, retro-orbital, with associated light noise sensitivity, movement made it worse, lying dark resting for couple hours will help her headaches, Tylenol as needed is helpful most of the time, she has very busy schedule, is a mother of 11 and 60 years old, also attend [employer] school, work part-time  She also began to complains of worsening tinnitus, was evaluated by ENT, There was a concern of dysfunction of bilateral eustachian tubes, she was also referred to [facility] tinnitus clinic, was given Flonase nasal spray, with limited help of her tinnitus,  Update December 28, 2018 SS: She reports she is taking nortriptyline 20 mg at bedtime.  She reports nortriptyline has been helpful, to lessen the severity of her headaches.  She says she was having 1-2 migraines a week, went to her primary in November for ear pain, had excess wax.  She was given a steroid injection.  Since then, has only had 1 migraine.  She does complain of daily mild headaches.  She describes her typical headache located on the right side, occipital, temporal, retro-orbital.  Associated with light, and noise sensitivity.  She has not found Imitrex to be beneficial.  She  has good benefit with Advil.  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  She presents today for evaluation accompanied by her daughter.  REVIEW OF SYSTEMS: Out of a complete 14 system review of symptoms, the patient complains only of the following symptoms, and all other reviewed systems are negative.  Headache  ALLERGIES: Allergies  Allergen Reactions   Penicillins     Unsure of reaction.Has patient had a PCN reaction causing immediate rash, facial/tongue/throat swelling, SOB or lightheadedness with hypotension unknown Has patient had a PCN reaction causing severe rash involving mucus membranes or skin necrosis: No Has patient had a PCN reaction that required hospitalization No Has patient had a PCN reaction occurring within the last 10 years:no If all of the above answers are "NO", then may proceed with Cephalosporin use.    HOME MEDICATIONS: Outpatient Medications Prior to Visit  Medication Sig Dispense Refill   ALBUTEROL SULFATE HFA IN Inhale into the lungs as needed. Reported on 01/02/2015     azelastine (ASTELIN) 0.1 % nasal spray USE 1 SPRAY(S) IN EACH NOSTRIL TWICE DAILY AS DIRECTED     Ibuprofen (ADVIL) 200 MG CAPS Take by mouth daily as needed.     levonorgestrel-ethinyl estradiol (ALESSE) 0.1-20 MG-MCG tablet Take by mouth.     loratadine (CLARITIN) 10 MG tablet Take 10 mg by mouth daily.     nortriptyline (PAMELOR) 10 MG capsule Take 2 capsules (20 mg total) by mouth at bedtime. 60 capsule 11   acetaminophen (TYLENOL) 325 MG tablet Take 650 mg by mouth every 6 (six)  hours as needed.     SUMAtriptan (IMITREX) 50 MG tablet Take 1 tablet (50 mg total) by mouth every 2 (two) hours as needed for migraine. May repeat in 2 hours if headache persists or recurs. (Patient not taking: Reported on 12/28/2018) 12 tablet 11   No facility-administered medications prior to visit.     PAST MEDICAL HISTORY: Past Medical History:  Diagnosis Date    Anxiety    Asthma    Chlamydia infection    Depression 2016   Eczema    Headache    Medical history non-contributory    Pregnant    Pregnant 06/13/2014   Scoliosis    Tinnitus     PAST SURGICAL HISTORY: Past Surgical History:  Procedure Laterality Date   TONSILLECTOMY AND ADENOIDECTOMY      FAMILY HISTORY: Family History  Problem Relation Age of Onset   Hypertension Father    Depression Father    Vision loss Father    Asthma Paternal Aunt    Thyroid disease Paternal Aunt    Hyperlipidemia Maternal Grandfather    Heart disease Paternal Grandmother    Hypertension Paternal Grandmother    Diabetes Paternal Grandfather    Hyperlipidemia Paternal Grandfather    Asthma Paternal Aunt    Seizures Mother    Other Mother        history of drug abuse   Colon cancer Neg Hx    Colon polyps Neg Hx     SOCIAL HISTORY: Social History   Socioeconomic History   Marital status: Married    Spouse name: Not on file   Number of children: 2   Years of education: in college   Highest education level: Not on file  Occupational History   Occupation: full-time Ship broker   Occupation: Environmental consultant - office job   Occupation: help w/ teenage pregnancy program  Scientist, product/process development strain: Not on file   Food insecurity    Worry: Not on file    Inability: Not on Lexicographer needs    Medical: Not on file    Non-medical: Not on file  Tobacco Use   Smoking status: Never Smoker   Smokeless tobacco: Never Used  Substance and Sexual Activity   Alcohol use: No   Drug use: No   Sexual activity: Yes    Birth control/protection: Condom, Injection  Lifestyle   Physical activity    Days per week: Not on file    Minutes per session: Not on file   Stress: Not on file  Relationships   Social connections    Talks on phone: Not on file    Gets together: Not on file    Attends religious service: Not on file    Active member of  club or organization: Not on file    Attends meetings of clubs or organizations: Not on file    Relationship status: Not on file   Intimate partner violence    Fear of current or ex partner: Not on file    Emotionally abused: Not on file    Physically abused: Not on file    Forced sexual activity: Not on file  Other Topics Concern   Not on file  Social History Narrative   Lives at home with husband and two children.     Right-handed.   Occasional use of caffeine.   PHYSICAL EXAM  Vitals:   12/28/18 1502  BP: 114/80  Pulse: 99  Temp: (!) 97 F (36.1 C)  Weight: 147 lb 12.8 oz (67 kg)  Height: 5' 5.5" (1.664 m)   Body mass index is 24.22 kg/m.  Generalized: Well developed, in no acute distress   Neurological examination  Mentation: Alert oriented to time, place, history taking. Follows all commands speech and language fluent Cranial nerve II-XII: Pupils were equal round reactive to light. Extraocular movements were full, visual field were full on confrontational test. Facial sensation and strength were normal. Head turning and shoulder shrug  were normal and symmetric. Motor: The motor testing reveals 5 over 5 strength of all 4 extremities. Good symmetric motor tone is noted throughout.  Sensory: Sensory testing is intact to soft touch on all 4 extremities. No evidence of extinction is noted.  Coordination: Cerebellar testing reveals good finger-nose-finger and heel-to-shin bilaterally.  Gait and station: Gait is normal. Tandem gait is normal.  Reflexes: Deep tendon reflexes are symmetric and normal bilaterally.   DIAGNOSTIC DATA (LABS, IMAGING, TESTING) - I reviewed patient records, labs, notes, testing and imaging myself where available.  Lab Results  Component Value Date   WBC 22.2 (H) 01/09/2015   HGB 11.0 (L) 01/09/2015   HCT 33.5 (L) 01/09/2015   MCV 84.4 01/09/2015   PLT 245 01/09/2015      Component Value Date/Time   NA 136 06/03/2007 1815   K 3.5  06/03/2007 1815   CL 105 06/03/2007 1815   CO2 29 06/03/2007 1815   GLUCOSE 96 06/03/2007 1815   BUN 9 06/03/2007 1815   CREATININE 0.56 06/03/2007 1815   CALCIUM 9.2 06/03/2007 1815   PROT 6.1 06/03/2007 1815   ALBUMIN 3.7 06/03/2007 1815   AST 22 06/03/2007 1815   ALT 18 06/03/2007 1815   ALKPHOS 164 06/03/2007 1815   BILITOT 0.8 06/03/2007 1815   GFRNONAA NOT CALCULATED 06/03/2007 1815   GFRAA  06/03/2007 1815    NOT CALCULATED        The eGFR has been calculated using the MDRD equation. This calculation has not been validated in all clinical   No results found for: CHOL, HDL, LDLCALC, LDLDIRECT, TRIG, CHOLHDL No results found for: HGBA1C No results found for: VITAMINB12 No results found for: TSH  ASSESSMENT AND PLAN 23 y.o. year old female  has a past medical history of Anxiety, Asthma, Chlamydia infection, Depression (2016), Eczema, Headache, Medical history non-contributory, Pregnant, Pregnant (06/13/2014), Scoliosis, and Tinnitus. here with:  1.  Chronic migraine headache -Continue nortriptyline 20 mg at bedtime (didn't increase because HR 99) -Start Topamax 50 mg at bedtime (not planning on pregnancy, is on birth control), wouldn't consider propranolol because history of asthma -Told recent diagnosis of TMJ, this may explain headaches on the right side, but still has migraine features, nortriptyline is recommended, may consider muscle relaxer in the future (per up to date) -Discontinue Imitrex due to lack of benefit -May take Advil for acute headache  -Follow-up in 4 months or sooner if needed  I spent 15 minutes with the patient. 50% of this time was spent discussing her plan of care.  Butler Denmark, AGNP-C, DNP 12/28/2018, 3:19 PM [facility] [address] [place], [place] [address] [phone]

## 2018-12-28 NOTE — Patient Instructions (Signed)
Continue nortriptyline 20 mg at bedtime  Start Topamax 25 mg tablet, take 1 at bedtime x 3 days, then take 2 at bedtime  You may continue Advil for headaches, I will stop Imitrex since it was not helpful

## 2019-01-04 NOTE — Progress Notes (Signed)
I have reviewed and agreed above plan. 

## 2019-01-09 ENCOUNTER — Other Ambulatory Visit: Payer: Self-pay

## 2019-01-09 ENCOUNTER — Ambulatory Visit (INDEPENDENT_AMBULATORY_CARE_PROVIDER_SITE_OTHER): Payer: BC Managed Care – PPO | Admitting: Adult Health

## 2019-01-09 ENCOUNTER — Encounter: Payer: Self-pay | Admitting: Adult Health

## 2019-01-09 VITALS — BP 130/84 | HR 120 | Ht 65.5 in | Wt 140.0 lb

## 2019-01-09 DIAGNOSIS — N907 Vulvar cyst: Secondary | ICD-10-CM

## 2019-01-09 NOTE — Progress Notes (Signed)
  Subjective:     Patient ID: [name], female   DOB: [birth year], 23 y.o.   MRN: [id number]  HPI [name] is a 23 year old white female, married, [id number], in complaining of ?knot in right labia. Noticed Friday. PCP is Nicholes Rough PA.   Review of Systems  Had ? Yeast infection and used 3 day OTC monistat and felt knot in right labia,noticed Friday  Denies any pain  Reviewed past medical,surgical, social and family history. Reviewed medications and allergies.    Objective:   Physical Exam BP 130/84 (BP Location: Left Arm, Patient Position: Sitting, Cuff Size: Normal)   Pulse (!) 120   Ht 5' 5.5" (1.664 m)   Wt 140 lb (63.5 kg)   LMP 12/26/2018   BMI 22.94 kg/m fall risk is low Skin warm and dry.Pelvic: external genitalia is normal in appearance,has BB sized white epidermal cyst right inner labia, non tender, vagina: scant white discharge without odor,urethra has no lesions or masses noted, cervix:smooth and bulbous, uterus: normal size, shape and contour, non tender, no masses felt, adnexa: no masses or tenderness noted. Bladder is non tender and no masses felt. Pt gave verbal consent for exam with out chaperone.     Assessment:     1. Epidermal cyst of vulva   just watch, and leave it alone, unless starts to get bigger and bother you    Plan:     Follow up prn

## 2019-05-02 NOTE — Progress Notes (Signed)
PATIENT: [name] DOB: 09-12-1995  REASON FOR VISIT: follow up HISTORY FROM: patient  HISTORY OF PRESENT ILLNESS: Today 05/03/19  HISTORY [name] ATHAis a 24 years old femaleseen in request byher ENT Dr. Lind Guest andprimary care PA Nicholes Rough for evaluation of frequent headaches, right-sided tinnitus, initial evaluation was on September 28, 2018.  I have reviewed and summarized the referring note from the referring physician.She reported history of intermittent bilateral headaches since high school, it used to be a bilateral frontal retro-orbital region, since 2019, she began to notice increased headaches, especially since June 2020, her headache become more frequent, 5 times each week, usually is located at right occipital, parietal temporal region, retro-orbital, with associated light noise sensitivity, movement made it worse, lying dark resting for couple hours will help her headaches, Tylenol as needed is helpful most of the time, she has very busy schedule, is a mother of 32 and 17 years old, also attend [employer], work part-time  She also began to complains of worsening tinnitus, was evaluated by ENT, There was a concern of dysfunction of bilateral eustachian tubes, she was also referred to [facility] tinnitus clinic, was given Flonase nasal spray, with limited help of her tinnitus,  Update December 28, 2018 SS: She reports she is taking nortriptyline 20 mg at bedtime.  She reports nortriptyline has been helpful, to lessen the severity of her headaches.  She says she was having 1-2 migraines a week, went to her primary in November for ear pain, had excess wax.  She was given a steroid injection.  Since then, has only had 1 migraine.  She does complain of daily mild headaches.  She describes her typical headache located on the right side, occipital, temporal, retro-orbital.  Associated with light, and noise sensitivity.  She has not found Imitrex to be beneficial.  She  has good benefit with Advil.  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  She presents today for evaluation accompanied by her daughter.  Update May 03, 2019 SS: She remains on nortriptyline 20 mg at bedtime, Topamax 50 mg at bedtime.  She takes Advil for acute headache, Imitrex lacked benefit.  She is also now on Zoloft for anxiety.  She indicates her headaches have improved, is having 2-3 headaches a week, not progressing to overt migraines.  She will take Aleve with good benefit.  She has chronic tinnitus of the right ear.  Continues to feel her heart racing, have elevated heart rate.  Reports had cardiac evaluation, was unremarkable. Wears mouth guard at night, reports makes TMJ symptoms worse.  She presents today for follow-up unaccompanied.  REVIEW OF SYSTEMS: Out of a complete 14 system review of symptoms, the patient complains only of the following symptoms, and all other reviewed systems are negative.  Headache  ALLERGIES: Allergies  Allergen Reactions  . Penicillins     Unsure of reaction.Has patient had a PCN reaction causing immediate rash, facial/tongue/throat swelling, SOB or lightheadedness with hypotension unknown Has patient had a PCN reaction causing severe rash involving mucus membranes or skin necrosis: No Has patient had a PCN reaction that required hospitalization No Has patient had a PCN reaction occurring within the last 10 years:no If all of the above answers are "NO", then may proceed with Cephalosporin use.    HOME MEDICATIONS: Outpatient Medications Prior to Visit  Medication Sig Dispense Refill  . acetaminophen (TYLENOL) 325 MG tablet Take 650 mg by mouth every 6 (  six) hours as needed.    . ALBUTEROL SULFATE HFA IN Inhale into the lungs as needed. Reported on 01/02/2015    . azelastine (ASTELIN) 0.1 % nasal spray USE 1 SPRAY(S) IN EACH NOSTRIL TWICE DAILY AS DIRECTED    . Ibuprofen (ADVIL) 200 MG CAPS Take by mouth daily as needed.      Marland Kitchen levonorgestrel-ethinyl estradiol (ALESSE) 0.1-20 MG-MCG tablet Take by mouth.    . loratadine (CLARITIN) 10 MG tablet Take 10 mg by mouth daily.    . sertraline (ZOLOFT) 50 MG tablet TAKE 1 2 (ONE HALF) TABLET BY MOUTH ONCE DAILY FOR 6 DAYS THEN INCREASE TO 1 TABLET ONCE DAILY    . nortriptyline (PAMELOR) 10 MG capsule Take 2 capsules (20 mg total) by mouth at bedtime. 60 capsule 11  . topiramate (TOPAMAX) 25 MG tablet Take 1 tablet at bedtime x 3 days, then take 2 tablets at bedtime 60 tablet 11   No facility-administered medications prior to visit.    PAST MEDICAL HISTORY: Past Medical History:  Diagnosis Date  . Anxiety   . Asthma   . Chlamydia infection   . Depression 2016  . Eczema   . Headache   . Medical history non-contributory   . Pregnant   . Pregnant 06/13/2014  . Scoliosis   . Tinnitus     PAST SURGICAL HISTORY: Past Surgical History:  Procedure Laterality Date  . TONSILLECTOMY AND ADENOIDECTOMY      FAMILY HISTORY: Family History  Problem Relation Age of Onset  . Hypertension Father   . Depression Father   . Vision loss Father   . Asthma Paternal Aunt   . Thyroid disease Paternal Aunt   . Hyperlipidemia Maternal Grandfather   . Heart disease Paternal Grandmother   . Hypertension Paternal Grandmother   . Diabetes Paternal Grandfather   . Hyperlipidemia Paternal Grandfather   . Asthma Paternal Aunt   . Seizures Mother   . Other Mother        history of drug abuse  . Colon cancer Neg Hx   . Colon polyps Neg Hx     SOCIAL HISTORY: Social History   Socioeconomic History  . Marital status: Married    Spouse name: Not on file  . Number of children: 2  . Years of education: in college  . Highest education level: Not on file  Occupational History  . Occupation: full-time Ship broker  . Occupation: Environmental consultant - office job  . Occupation: help w/ teenage pregnancy program  Tobacco Use  . Smoking status: Never Smoker  . Smokeless tobacco: Never Used   Substance and Sexual Activity  . Alcohol use: No  . Drug use: No  . Sexual activity: Yes    Birth control/protection: Condom, Injection  Other Topics Concern  . Not on file  Social History Narrative   Lives at home with husband and two children.     Right-handed.   Occasional use of caffeine.   Social Determinants of Health   Financial Resource Strain:   . Difficulty of Paying Living Expenses:   Food Insecurity:   . Worried About Charity fundraiser in the Last Year:   . Arboriculturist in the Last Year:   Transportation Needs:   . Film/video editor (Medical):   Marland Kitchen Lack of Transportation (Non-Medical):   Physical Activity:   . Days of Exercise per Week:   . Minutes of Exercise per Session:   Stress:   . Feeling of Stress :  Social Connections:   . Frequency of Communication with Friends and Family:   . Frequency of Social Gatherings with Friends and Family:   . Attends Religious Services:   . Active Member of Clubs or Organizations:   . Attends Archivist Meetings:   Marland Kitchen Marital Status:   Intimate Partner Violence:   . Fear of Current or Ex-Partner:   . Emotionally Abused:   Marland Kitchen Physically Abused:   . Sexually Abused:    PHYSICAL EXAM  Vitals:   05/03/19 0934  BP: 114/77  Pulse: (!) 120  Temp: 97.9 F (36.6 C)  Weight: 132 lb 3.2 oz (60 kg)  Height: 5' 5.5" (1.664 m)   Body mass index is 21.66 kg/m.  Generalized: Well developed, in no acute distress   Neurological examination  Mentation: Alert oriented to time, place, history taking. Follows all commands speech and language fluent Cranial nerve II-XII: Pupils were equal round reactive to light. Extraocular movements were full, visual field were full on confrontational test. Facial sensation and strength were normal. Head turning and shoulder shrug  were normal and symmetric. Motor: The motor testing reveals 5 over 5 strength of all 4 extremities. Good symmetric motor tone is noted throughout.   Sensory: Sensory testing is intact to soft touch on all 4 extremities. No evidence of extinction is noted.  Coordination: Cerebellar testing reveals good finger-nose-finger and heel-to-shin bilaterally.  Gait and station: Gait is normal. Tandem gait is normal. Romberg is negative. No drift is seen.  Reflexes: Deep tendon reflexes are symmetric and normal bilaterally.   DIAGNOSTIC DATA (LABS, IMAGING, TESTING) - I reviewed patient records, labs, notes, testing and imaging myself where available.  Lab Results  Component Value Date   WBC 22.2 (H) 01/09/2015   HGB 11.0 (L) 01/09/2015   HCT 33.5 (L) 01/09/2015   MCV 84.4 01/09/2015   PLT 245 01/09/2015      Component Value Date/Time   NA 136 06/03/2007 1815   K 3.5 06/03/2007 1815   CL 105 06/03/2007 1815   CO2 29 06/03/2007 1815   GLUCOSE 96 06/03/2007 1815   BUN 9 06/03/2007 1815   CREATININE 0.56 06/03/2007 1815   CALCIUM 9.2 06/03/2007 1815   PROT 6.1 06/03/2007 1815   ALBUMIN 3.7 06/03/2007 1815   AST 22 06/03/2007 1815   ALT 18 06/03/2007 1815   ALKPHOS 164 06/03/2007 1815   BILITOT 0.8 06/03/2007 1815   GFRNONAA NOT CALCULATED 06/03/2007 1815   GFRAA  06/03/2007 1815    NOT CALCULATED        The eGFR has been calculated using the MDRD equation. This calculation has not been validated in all clinical   No results found for: CHOL, HDL, LDLCALC, LDLDIRECT, TRIG, CHOLHDL No results found for: HGBA1C No results found for: VITAMINB12 No results found for: TSH    ASSESSMENT AND PLAN 25 y.o. year old female  has a past medical history of Anxiety, Asthma, Chlamydia infection, Depression (2016), Eczema, Headache, Medical history non-contributory, Pregnant, Pregnant (06/13/2014), Scoliosis, and Tinnitus. here with:  1.  Chronic migraine headache -Overall, headaches improved, 2-3 headaches a week, not progressing to migraine, relieved with Aleve -Continues to have report of tachycardia, sensation of heart racing, heart rate  was 120 today, possibility of nortriptyline contributing to tachycardia?  Cardiac evaluation was unremarkable -We will increase Topamax 75 mg at bedtime -Decrease nortriptyline 10 mg at bedtime x1 week, then stop, monitor heart rate, see if any benefit with discontinuation of nortriptyline -If headaches  increase, could consider higher dose of Topamax, possibly CGRP, or even low-dose Flexeril at bedtime if TMJ could be contributing -Patient is on birth control pills, discussed Topamax is not safe in pregnancy -She will follow-up in 4 months or sooner if needed, send my chart message if needed  I spent 20 minutes of face-to-face and non-face-to-face time with patient.  This included previsit chart review, lab review, study review, order entry, electronic health record documentation, patient education.  Butler Denmark, AGNP-C, DNP 05/03/2019, 10:22 AM [facility] [address] [place], [place] [address] [phone]

## 2019-05-03 ENCOUNTER — Other Ambulatory Visit: Payer: Self-pay

## 2019-05-03 ENCOUNTER — Encounter: Payer: Self-pay | Admitting: Neurology

## 2019-05-03 ENCOUNTER — Ambulatory Visit (INDEPENDENT_AMBULATORY_CARE_PROVIDER_SITE_OTHER): Payer: BC Managed Care – PPO | Admitting: Neurology

## 2019-05-03 VITALS — BP 114/77 | HR 120 | Temp 97.9°F | Ht 65.5 in | Wt 132.2 lb

## 2019-05-03 DIAGNOSIS — IMO0002 Reserved for concepts with insufficient information to code with codable children: Secondary | ICD-10-CM

## 2019-05-03 DIAGNOSIS — G43709 Chronic migraine without aura, not intractable, without status migrainosus: Secondary | ICD-10-CM | POA: Diagnosis not present

## 2019-05-03 MED ORDER — TOPIRAMATE 25 MG PO TABS: ORAL_TABLET | ORAL | 11 refills | Status: DC

## 2019-05-03 NOTE — Patient Instructions (Signed)
Increase Topamax 75 mg at bedtime  Decrease nortriptyline 10 mg x 1 week, then stop  If headaches increase, let me know, can: 1. Go up higher on Topamax  2. Consider CGRP medication injection  3. Possibly muscle relaxer at night for TMJ  See you back in 6 months

## 2019-05-15 ENCOUNTER — Encounter: Payer: Self-pay | Admitting: Neurology

## 2019-05-30 NOTE — Progress Notes (Signed)
I have reviewed and agreed above plan. 

## 2019-08-22 DIAGNOSIS — F411 Generalized anxiety disorder: Secondary | ICD-10-CM | POA: Insufficient documentation

## 2019-09-18 ENCOUNTER — Ambulatory Visit: Payer: BC Managed Care – PPO | Admitting: Neurology

## 2019-09-27 ENCOUNTER — Encounter: Payer: Self-pay | Admitting: Allergy & Immunology

## 2019-09-27 ENCOUNTER — Ambulatory Visit (INDEPENDENT_AMBULATORY_CARE_PROVIDER_SITE_OTHER): Payer: BC Managed Care – PPO | Admitting: Allergy & Immunology

## 2019-09-27 ENCOUNTER — Other Ambulatory Visit: Payer: Self-pay

## 2019-09-27 VITALS — BP 108/62 | HR 92 | Temp 98.4°F | Resp 18 | Ht 66.0 in | Wt 147.0 lb

## 2019-09-27 DIAGNOSIS — J3089 Other allergic rhinitis: Secondary | ICD-10-CM | POA: Diagnosis not present

## 2019-09-27 DIAGNOSIS — J4530 Mild persistent asthma, uncomplicated: Secondary | ICD-10-CM

## 2019-09-27 DIAGNOSIS — J302 Other seasonal allergic rhinitis: Secondary | ICD-10-CM | POA: Diagnosis not present

## 2019-09-27 DIAGNOSIS — B999 Unspecified infectious disease: Secondary | ICD-10-CM

## 2019-09-27 DIAGNOSIS — J4520 Mild intermittent asthma, uncomplicated: Secondary | ICD-10-CM | POA: Insufficient documentation

## 2019-09-27 MED ORDER — ALVESCO 160 MCG/ACT IN AERS: 1.0000 | INHALATION_SPRAY | Freq: Two times a day (BID) | RESPIRATORY_TRACT | 0 refills | Status: AC

## 2019-09-27 MED ORDER — AZELASTINE HCL 0.1 % NA SOLN
NASAL | 5 refills | Status: DC
Start: 2019-09-27 — End: 2020-11-13

## 2019-09-27 NOTE — Patient Instructions (Signed)
1. Mild intermittent asthma, uncomplicated - Lung testing looked fairly good today. - There was a lot of improvement with the albuterol treatment.  - Therefore we are going to go ahead and start a daily controller medication - Spacer sample and demonstration provided. - Daily controller medication(s): Alvesco one puff twice daily with spacer - Prior to physical activity: albuterol 2 puffs 10-15 minutes before physical activity. - Rescue medications: albuterol 4 puffs every 4-6 hours as needed - Changes during respiratory infections or worsening symptoms: Increase Alvesco to 2 puffs twice daily for TWO WEEKS. - Asthma control goals:  * Full participation in all desired activities (may need albuterol before activity) * Albuterol use two time or less a week on average (not counting use with activity) * Cough interfering with sleep two time or less a month * Oral steroids no more than once a year * No hospitalizations  2. Chronic rhinitis - Testing today showed: mouse, grasses, ragweed, weeds, trees, indoor molds, outdoor molds, dust mites, cat, dog and cockroach - Copy of test results provided.  - Avoidance measures provided. - Continue with: Flonase (fluticasone) one spray per nostril daily - Start taking: Xyzal (levocetirizine) 5mg  tablet once daily and Astelin (azelastine) 2 sprays per nostril 1-2 times daily as needed - You can use an extra dose of the antihistamine, if needed, for breakthrough symptoms.  - Consider nasal saline rinses 1-2 times daily to remove allergens from the nasal cavities as well as help with mucous clearance (this is especially helpful to do before the nasal sprays are given) - Strongly consider allergy shots as a means of long-term control. - Allergy shots "re-train" and "reset" the immune system to ignore environmental allergens and decrease the resulting immune response to those allergens (sneezing, itchy watery eyes, runny nose, nasal congestion,  etc).    - Allergy shots improve symptoms in 75-85% of patients.  - We can discuss more at the next appointment if the medications are not working for you.  3. Recurrent infections - We will obtain some screening labs to evaluate your immune system.  - Labs to evaluate the quantitative [facility]) aspects of your immune system: IgG/IgA/IgM, CBC with differential - Labs to evaluate the qualitative (HOW WELL THEY WORK) aspects of your immune system: CH50, Pneumococcal titers, Tetanus titers, Diphtheria titers - We may consider immunizations with Pneumovax and Tdap to challenge your immune system, and then obtain repeat titers in 4-6 weeks.   4. Return in about 4 weeks (around 10/25/2019). This can be an in-person, a virtual Webex or a telephone follow up visit.   Please inform 12/25/2019 of any Emergency Department visits, hospitalizations, or changes in symptoms. Call us before going to the ED for breathing or allergy symptoms since we might be able to fit you in for a sick visit. Feel free to contact us anytime with any questions, problems, or concerns.  It was a pleasure to meet you today!  Websites that have reliable patient information: 1. American Academy of Asthma, Allergy, and Immunology: www.aaaai.org 2. Food Allergy Research and Education (FARE): foodallergy.org 3. Mothers of Asthmatics: http://www.asthmacommunitynetwork.org 4. American College of Allergy, Asthma, and Immunology: www.acaai.org   COVID-19 Vaccine Information can be found at: [place] For questions related to vaccine distribution or appointments, please email [email or web address] or call [phone].     "Like" [phone] on Facebook and Instagram for our latest updates!        Make sure you are registered to vote! If you have moved  or changed any of your contact information, you will need to get this updated before voting!  In some cases, you MAY be able  to register to vote online: [email or web address]    Reducing Pollen Exposure  The American Academy of Allergy, Asthma and Immunology suggests the following steps to reduce your exposure to pollen during allergy seasons.    1. Do not hang sheets or clothing out to dry; pollen may collect on these items. 2. Do not mow lawns or spend time around freshly cut grass; mowing stirs up pollen. 3. Keep windows closed at night.  Keep car windows closed while driving. 4. Minimize morning activities outdoors, a time when pollen counts are usually at their highest. 5. Stay indoors as much as possible when pollen counts or humidity is high and on windy days when pollen tends to remain in the air longer. 6. Use air conditioning when possible.  Many air conditioners have filters that trap the pollen spores. 7. Use a HEPA room air filter to remove pollen form the indoor air you breathe.  Control of Mold Allergen   Mold and fungi can grow on a variety of surfaces provided certain temperature and moisture conditions exist.  Outdoor molds grow on plants, decaying vegetation and soil.  The major outdoor mold, Alternaria and Cladosporium, are found in very high numbers during hot and dry conditions.  Generally, a late Summer - Fall peak is seen for common outdoor fungal spores.  Rain will temporarily lower outdoor mold spore count, but counts rise rapidly when the rainy period ends.  The most important indoor molds are Aspergillus and Penicillium.  Dark, humid and poorly ventilated basements are ideal sites for mold growth.  The next most common sites of mold growth are the bathroom and the kitchen.  Outdoor (Seasonal) Mold Control   1. Use air conditioning and keep windows closed 2. Avoid exposure to decaying vegetation. 3. Avoid leaf raking. 4. Avoid grain handling. 5. Consider wearing a face mask if working in moldy areas.    Indoor (Perennial) Mold Control     1. Maintain  humidity below 50%. 2. Clean washable surfaces with 5% bleach solution. 3. Remove sources e.g. contaminated carpets.     Control of Dust Mite Allergen    Dust mites play a major role in allergic asthma and rhinitis.  They occur in environments with high humidity wherever human skin is found.  Dust mites absorb humidity from the atmosphere (ie, they do not drink) and feed on organic matter (including shed human and animal skin).  Dust mites are a microscopic type of insect that you cannot see with the naked eye.  High levels of dust mites have been detected from mattresses, pillows, carpets, upholstered furniture, bed covers, clothes, soft toys and any woven material.  The principal allergen of the dust mite is found in its feces.  A gram of dust may contain 1,000 mites and 250,000 fecal particles.  Mite antigen is easily measured in the air during house cleaning activities.  Dust mites do not bite and do not cause harm to humans, other than by triggering allergies/asthma.    Ways to decrease your exposure to dust mites in your home:  1. Encase mattresses, box springs and pillows with a mite-impermeable barrier or cover   2. Wash sheets, blankets and drapes weekly in hot water (130 F) with detergent and dry them in a dryer on the hot setting.  3. Have the room cleaned frequently with a vacuum cleaner  and a damp dust-mop.  For carpeting or rugs, vacuuming with a vacuum cleaner equipped with a high-efficiency particulate air (HEPA) filter.  The dust mite allergic individual should not be in a room which is being cleaned and should wait 1 hour after cleaning before going into the room. 4. Do not sleep on upholstered furniture (eg, couches).   5. If possible removing carpeting, upholstered furniture and drapery from the home is ideal.  Horizontal blinds should be eliminated in the rooms where the person spends the most time (bedroom, study, television room).  Washable vinyl, roller-type shades are  optimal. 6. Remove all non-washable stuffed toys from the bedroom.  Wash stuffed toys weekly like sheets and blankets above.   7. Reduce indoor humidity to less than 50%.  Inexpensive humidity monitors can be purchased at most hardware stores.  Do not use a humidifier as can make the problem worse and are not recommended.  Control of Dog or Cat Allergen  Avoidance is the best way to manage a dog or cat allergy. If you have a dog or cat and are allergic to dog or cats, consider removing the dog or cat from the home. If you have a dog or cat but don't want to find it a new home, or if your family wants a pet even though someone in the household is allergic, here are some strategies that may help keep symptoms at bay:  1. Keep the pet out of your bedroom and restrict it to only a few rooms. Be advised that keeping the dog or cat in only one room will not limit the allergens to that room. 2. Don't pet, hug or kiss the dog or cat; if you do, wash your hands with soap and water. 3. High-efficiency particulate air (HEPA) cleaners run continuously in a bedroom or living room can reduce allergen levels over time. 4. Regular use of a high-efficiency vacuum cleaner or a central vacuum can reduce allergen levels. 5. Giving your dog or cat a bath at least once a week can reduce airborne allergen.  Control of Cockroach Allergen  Cockroach allergen has been identified as an important cause of acute attacks of asthma, especially in urban settings.  There are fifty-five species of cockroach that exist in the [place], however only three, the [place], [place] species produce allergen that can affect patients with Asthma.  Allergens can be obtained from fecal particles, egg casings and secretions from cockroaches.    1. Remove food sources. 2. Reduce access to water. 3. Seal access and entry points. 4. Spray runways with 0.5-1% Diazinon or Chlorpyrifos 5. Blow boric acid power under stoves and  refrigerator. 6. Place bait stations (hydramethylnon) at feeding sites.  Allergy Shots   Allergies are the result of a chain reaction that starts in the immune system. Your immune system controls how your body defends itself. For instance, if you have an allergy to pollen, your immune system identifies pollen as an invader or allergen. Your immune system overreacts by producing antibodies called Immunoglobulin E (IgE). These antibodies travel to cells that release chemicals, causing an allergic reaction.  The concept behind allergy immunotherapy, whether it is received in the form of shots or tablets, is that the immune system can be desensitized to specific allergens that trigger allergy symptoms. Although it requires time and patience, the payback can be long-term relief.  How Do Allergy Shots Work?  Allergy shots work much like a vaccine. Your body responds to injected amounts  of a particular allergen given in increasing doses, eventually developing a resistance and tolerance to it. Allergy shots can lead to decreased, minimal or no allergy symptoms.  There generally are two phases: build-up and maintenance. Build-up often ranges from three to six months and involves receiving injections with increasing amounts of the allergens. The shots are typically given once or twice a week, though more rapid build-up schedules are sometimes used.  The maintenance phase begins when the most effective dose is reached. This dose is different for each person, depending on how allergic you are and your response to the build-up injections. Once the maintenance dose is reached, there are longer periods between injections, typically two to four weeks.  Occasionally doctors give cortisone-type shots that can temporarily reduce allergy symptoms. These types of shots are different and should not be confused with allergy immunotherapy shots.  Who Can Be Treated with Allergy Shots?  Allergy shots may be a good  treatment approach for people with allergic rhinitis (hay fever), allergic asthma, conjunctivitis (eye allergy) or stinging insect allergy.   Before deciding to begin allergy shots, you should consider:  . The length of allergy season and the severity of your symptoms . Whether medications and/or changes to your environment can control your symptoms . Your desire to avoid long-term medication use . Time: allergy immunotherapy requires a major time commitment . Cost: may vary depending on your insurance coverage  Allergy shots for children age 67 and older are effective and often well tolerated. They might prevent the onset of new allergen sensitivities or the progression to asthma.  Allergy shots are not started on patients who are pregnant but can be continued on patients who become pregnant while receiving them. In some patients with other medical conditions or who take certain common medications, allergy shots may be of risk. It is important to mention other medications you talk to your allergist.   When Will I Feel Better?  Some may experience decreased allergy symptoms during the build-up phase. For others, it may take as long as 12 months on the maintenance dose. If there is no improvement after a year of maintenance, your allergist will discuss other treatment options with you.  If you aren't responding to allergy shots, it may be because there is not enough dose of the allergen in your vaccine or there are missing allergens that were not identified during your allergy testing. Other reasons could be that there are high levels of the allergen in your environment or major exposure to non-allergic triggers like tobacco smoke.  What Is the Length of Treatment?  Once the maintenance dose is reached, allergy shots are generally continued for three to five years. The decision to stop should be discussed with your allergist at that time. Some people may experience a permanent reduction of  allergy symptoms. Others may relapse and a longer course of allergy shots can be considered.  What Are the Possible Reactions?  The two types of adverse reactions that can occur with allergy shots are local and systemic. Common local reactions include very mild redness and swelling at the injection site, which can happen immediately or several hours after. A systemic reaction, which is less common, affects the entire body or a particular body system. They are usually mild and typically respond quickly to medications. Signs include increased allergy symptoms such as sneezing, a stuffy nose or hives.  Rarely, a serious systemic reaction called anaphylaxis can develop. Symptoms include swelling in the throat, wheezing, a  feeling of tightness in the chest, nausea or dizziness. Most serious systemic reactions develop within 30 minutes of allergy shots. This is why it is strongly recommended you wait in your doctor's office for 30 minutes after your injections. Your allergist is trained to watch for reactions, and his or her staff is trained and equipped with the proper medications to identify and treat them.  Who Should Administer Allergy Shots?  The preferred location for receiving shots is your prescribing allergist's office. Injections can sometimes be given at another facility where the physician and staff are trained to recognize and treat reactions, and have received instructions by your prescribing allergist.

## 2019-09-27 NOTE — Progress Notes (Signed)
NEW PATIENT  Date of Service/Encounter:  09/27/19  Referring provider: Ladora Daniel, PA-C   Assessment:   Mild persistent asthma, uncomplicated  Seasonal and perennial allergic rhinitis (mouse, grasses, ragweed, weeds, trees, indoor molds, outdoor molds, dust mites, cat, dog and cockroach)  Recurrent infections  Plan/Recommendations:   1. Mild persistent asthma, uncomplicated - Lung testing looked fairly good today. - There was a lot of improvement with the albuterol treatment.  - Therefore we are going to go ahead and start a daily controller medication - Spacer sample and demonstration provided. - Daily controller medication(s): Alvesco one puff twice daily with spacer - Prior to physical activity: albuterol 2 puffs 10-15 minutes before physical activity. - Rescue medications: albuterol 4 puffs every 4-6 hours as needed - Changes during respiratory infections or worsening symptoms: Increase Alvesco to 2 puffs twice daily for TWO WEEKS. - Asthma control goals:  * Full participation in all desired activities (may need albuterol before activity) * Albuterol use two time or less a week on average (not counting use with activity) * Cough interfering with sleep two time or less a month * Oral steroids no more than once a year * No hospitalizations  2. Chronic rhinitis - Testing today showed: mouse, grasses, ragweed, weeds, trees, indoor molds, outdoor molds, dust mites, cat, dog and cockroach - Copy of test results provided.  - Avoidance measures provided. - Continue with: Flonase (fluticasone) one spray per nostril daily - Start taking: Xyzal (levocetirizine)  tablet once daily and Astelin (azelastine) 2 sprays per nostril 1-2 times daily as needed - You can use an extra dose of the antihistamine, if needed, for breakthrough symptoms.  - Consider nasal saline rinses 1-2 times daily to remove allergens from the nasal cavities as well as help with mucous  clearance (this is especially helpful to do before the nasal sprays are given) - Strongly consider allergy shots as a means of long-term control. - Allergy shots "re-train" and "reset" the immune system to ignore environmental allergens and decrease the resulting immune response to those allergens (sneezing, itchy watery eyes, runny nose, nasal congestion, etc).    - Allergy shots improve symptoms in 75-85% of patients.  - We can discuss more at the next appointment if the medications are not working for you.  3. Recurrent infections - We will obtain some screening labs to evaluate your immune system.  - Labs to evaluate the quantitative [facility]) aspects of your immune system: IgG/IgA/IgM, CBC with differential - Labs to evaluate the qualitative (HOW WELL THEY WORK) aspects of your immune system: CH50, Pneumococcal titers, Tetanus titers, Diphtheria titers - We may consider immunizations with Pneumovax and Tdap to challenge your immune system, and then obtain repeat titers in 4-6 weeks.   4. Return in about 4 weeks (around 10/25/2019). This can be an in-person, a virtual Webex or a telephone follow up visit.  Subjective:   [name] is a 24 y.o. female presenting today for evaluation of  Chief Complaint  Patient presents with  . Allergic Rhinitis     cardiologist recommended that she be reevaluated. does not report any problematic symptoms at this time. spring time gives her the most trouble.   . Asthma    asthma only seems to bother her at night.    [name] has a history of the following: Patient Active Problem List   Diagnosis Date Noted  . Mild intermittent asthma, uncomplicated 09/27/2019  . Recurrent infections 09/27/2019  .  Seasonal and perennial allergic rhinitis 09/27/2019  . Epidermal cyst of vulva 01/09/2019  . Chronic migraine 09/28/2018  . Family planning 02/05/2017  . Encounter for initial prescription of contraceptive pills 02/05/2017  . LLQ pain  07/30/2016  . Mastalgia 01/01/2016  . Accessory breast in female 01/17/2015  . History of chlamydia 04/03/2014  . STD exposure 01/15/2014  . 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 Rh negative status during pregnancy 10/29/2012    History obtained from: chart review and patient.  [name] was referred by Joanne Daniel, PA-C.     [name] is a 24 y.o. female presenting for an evaluation of asthma and allergies.   Asthma/Respiratory Symptom History: She has a history of asthma that was diagnosed when she was around 24 years of age.  She reports very poor control as a child with multiple hospitalizations as well as many missed school days.  She states she was on just albuterol at that time.  She is not sure she was ever on a controller medication.  Triggers included certain environments, including being at her home where there was a smoker.  Her father was a big smoker.  At this point, her breathing seems to be under good control.  She has an inhaler to use as needed.  She tends to have more nighttime awakenings in the spring, but otherwise sleeps well through the night.  She has not needed prednisone in years.  Allergic Rhinitis Symptom History: She was tested when she was a child.  She does not remember what was positive.  Her parents told her that she was allergic to everything. She has not been on allergy shots in the past. She does have an OTC antihistamine that she needs. She is on Flonase OTC. She has seen ENT in the past for management of her tinnitus, although she does not remember her name.   Food Allergy Symptom History: She tolerates all the major food allergens without adverse event aside from cinnamon.  Cinnamon causes itching of her throat. She otherwise tolerates all of the other food allergies without adverse event.  She does have a history of palpitations which started with the initiation of nortriptyline.  She also reports a history of recurrent infections.  Thus far in 2021, she reports  3-4 antibiotic courses.  These are for things as variable as ear infections to skin infections and bronchitis.  She has never had an immune work-up.  Otherwise, there is no history of other atopic diseases, including drug allergies, stinging insect allergies, eczema, urticaria or contact dermatitis. There is no significant infectious history. Vaccinations are up to date.    Past Medical History: Patient Active Problem List   Diagnosis Date Noted  . Mild intermittent asthma, uncomplicated 09/27/2019  . Recurrent infections 09/27/2019  . Seasonal and perennial allergic rhinitis 09/27/2019  . Epidermal cyst of vulva 01/09/2019  . Chronic migraine 09/28/2018  . Family planning 02/05/2017  . Encounter for initial prescription of contraceptive pills 02/05/2017  . LLQ pain 07/30/2016  . Mastalgia 01/01/2016  . Accessory breast in female 01/17/2015  . History of chlamydia 04/03/2014  . STD exposure 01/15/2014  . 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 Rh negative status during pregnancy 10/29/2012    Medication List:  Allergies as of 09/27/2019      Reactions   Nortriptyline Palpitations   Penicillins    Unsure of reaction.Has patient had a PCN reaction causing immediate rash, facial/tongue/throat swelling, SOB or lightheadedness with hypotension unknown Has patient had a PCN  reaction causing severe rash involving mucus membranes or skin necrosis: No Has patient had a PCN reaction that required hospitalization No Has patient had a PCN reaction occurring within the last 10 years:no If all of the above answers are "NO", then may proceed with Cephalosporin use.      Medication List       Accurate as of September 27, 2019 12:23 PM. If you have any questions, ask your nurse or doctor.        STOP taking these medications   topiramate 25 MG tablet Commonly known as: TOPAMAX Stopped by: Alfonse Spruce, MD     TAKE these medications   acetaminophen 325 MG tablet Commonly known as:  TYLENOL Take 650 mg by mouth every 6 (six) hours as needed.   Advil 200 MG Caps Generic drug: Ibuprofen Take by mouth daily as needed.   ALBUTEROL SULFATE HFA IN Inhale into the lungs as needed. Reported on 01/02/2015   Alvesco 160 MCG/ACT inhaler Generic drug: ciclesonide Inhale 1 puff into the lungs 2 (two) times daily. Started by: Alfonse Spruce, MD   azelastine 0.1 % nasal spray Commonly known as: ASTELIN 2 sprays in each nostril 1-2 times daily as needed. What changed: See the new instructions. Changed by: Alfonse Spruce, MD   doxycycline 100 MG tablet Commonly known as: VIBRA-TABS Take by mouth.   levonorgestrel-ethinyl estradiol 0.1-20 MG-MCG tablet Commonly known as: ALESSE Take by mouth.   loratadine 10 MG tablet Commonly known as: CLARITIN Take 10 mg by mouth daily.   sertraline 50 MG tablet Commonly known as: ZOLOFT TAKE 1 2 (ONE HALF) TABLET BY MOUTH ONCE DAILY FOR 6 DAYS THEN INCREASE TO 1 TABLET ONCE DAILY       Birth History: non-contributory  Developmental History: non-contributory  Past Surgical History: Past Surgical History:  Procedure Laterality Date  . ADENOIDECTOMY    . TONSILLECTOMY AND ADENOIDECTOMY       Family History: Family History  Problem Relation Age of Onset  . Hypertension Father   . Depression Father   . Vision loss Father   . Asthma Father   . COPD Father   . Asthma Paternal Aunt   . Thyroid disease Paternal Aunt   . Hyperlipidemia Maternal Grandfather   . Heart disease Paternal Grandmother   . Hypertension Paternal Grandmother   . Diabetes Paternal Grandfather   . Hyperlipidemia Paternal Grandfather   . Asthma Paternal Aunt   . Seizures Mother   . Other Mother        history of drug abuse  . Colon cancer Neg Hx   . Colon polyps Neg Hx      Social History: Mallisa lives at home with her family.  20 she was in a mobile home of unknown age.  There is hardwood and laminate throughout the home.   They have electric heating and central cooling.  There are no animals inside or outside of the home.  There are no dust mite covers on the bedding.  There is no tobacco exposure.  She currently is going to school full-time at [employer].  She is majoring in early childhood development.  She is not exposed to fumes, chemicals, or dust.  She does not have a HEPA filter in the home.  They do not live near an interstate or industrial area.   Review of Systems  Constitutional: Negative.  Negative for chills, fever, malaise/fatigue and weight loss.  HENT: Negative.  Negative for congestion, ear discharge,  ear pain and sore throat.   Eyes: Negative for pain, discharge and redness.  Respiratory: Negative for cough, sputum production, shortness of breath and wheezing.   Cardiovascular: Negative.  Negative for chest pain and palpitations.  Gastrointestinal: Negative for abdominal pain, constipation, diarrhea, heartburn, nausea and vomiting.  Skin: Negative.  Negative for itching and rash.  Neurological: Negative for dizziness and headaches.  Endo/Heme/Allergies: Negative for environmental allergies. Does not bruise/bleed easily.       Objective:   Blood pressure 108/62, pulse 92, temperature 98.4 F (36.9 C), temperature source Temporal, resp. rate 18, height  (1.676 m), weight 147 lb (66.7 kg), SpO2 97 %. Body mass index is 23.73 kg/m.   Physical Exam:   Physical Exam Constitutional:      Appearance: She is well-developed.     Comments: Pleasant female.   HENT:     Head: Normocephalic and atraumatic.     Right Ear: Tympanic membrane, ear canal and external ear normal. No drainage, swelling or tenderness. Tympanic membrane is not injected, scarred, erythematous, retracted or bulging.     Left Ear: Tympanic membrane, ear canal and external ear normal. No drainage, swelling or tenderness. Tympanic membrane is not injected, scarred, erythematous, retracted or bulging.     Nose: No nasal  deformity, septal deviation, mucosal edema or rhinorrhea.     Right Turbinates: Enlarged and swollen.     Left Turbinates: Enlarged and swollen.     Right Sinus: No maxillary sinus tenderness or frontal sinus tenderness.     Left Sinus: No maxillary sinus tenderness or frontal sinus tenderness.     Mouth/Throat:     Mouth: Mucous membranes are not pale and not dry.     Pharynx: Uvula midline.     Comments: Cobblestoning in the posterior oropharynx.  Eyes:     General:        Right eye: No discharge.        Left eye: No discharge.     Conjunctiva/sclera: Conjunctivae normal.     Right eye: Right conjunctiva is not injected. No chemosis.    Left eye: Left conjunctiva is not injected. No chemosis.    Pupils: Pupils are equal, round, and reactive to light.  Cardiovascular:     Rate and Rhythm: Normal rate and regular rhythm.     Heart sounds: Normal heart sounds.  Pulmonary:     Effort: Pulmonary effort is normal. No tachypnea, accessory muscle usage or respiratory distress.     Breath sounds: Normal breath sounds. No wheezing, rhonchi or rales.     Comments: Moving air well in all lung fields.  Chest:     Chest wall: No tenderness.  Abdominal:     Tenderness: There is no abdominal tenderness. There is no guarding or rebound.  Lymphadenopathy:     Head:     Right side of head: No submandibular, tonsillar or occipital adenopathy.     Left side of head: No submandibular, tonsillar or occipital adenopathy.     Cervical: No cervical adenopathy.  Skin:    Coloration: Skin is not pale.     Findings: No abrasion, erythema, petechiae or rash. Rash is not papular, urticarial or vesicular.     Comments: No eczematous or urticarial lesions noted.   Neurological:     Mental Status: She is alert.  Psychiatric:        Behavior: Behavior is cooperative.      Diagnostic studies:   Spirometry: results normal (FEV1: 2.55/73%, FVC:  3.46/85%, FEV1/FVC: 74%).    Spirometry consistent with  normal pattern. Xopenex four puffs via MDI treatment given in clinic with significant improvement in FEV1 and FVC per ATS criteria.  Allergy Studies:     Airborne Adult Perc - 09/27/19 0900    Time Antigen Placed 0454    Allergen Manufacturer Waynette Buttery    Location Back    Number of Test 59    1. Control-Buffer 50% Glycerol Negative    2. Control-Histamine 1 mg/ml 2+    3. Albumin saline Negative    4. Bahia 4+    5. [place] 4+    6. Johnson 4+    7. Kentucky Blue 3+    8. Meadow Fescue 2+    9. Perennial Rye 3+    10. Sweet Vernal 4+    11. Timothy 4+    12. Cocklebur Negative    13. Burweed Marshelder Negative    14. Ragweed, short 2+    15. Ragweed, Giant 2+    16. Plantain,  English 2+    17. Lamb's Quarters Negative    18. Sheep Sorrell Negative    19. Rough Pigweed 2+    20. Marsh Elder, Rough Negative    21. Mugwort, Common Negative    22. Ash mix Negative    23. Birch mix Negative    24. Beech American 2+    25. Box, Elder Negative    26. Cedar, red Negative    27. Cottonwood, Guinea[place]au Negative    28. Elm mix Negative    29. Hickory Negative    30. Maple mix Negative    31. Oak, [place] mix Negative    32. Pecan Pollen Negative    33. Pine mix Negative    34. Sycamore Eastern Negative    35. Walnut, Black Pollen Negative    36. Alternaria alternata 3+    37. Cladosporium Herbarum 3+    38. Aspergillus mix Negative    39. Penicillium mix Negative    40. Bipolaris sorokiniana (Helminthosporium) Negative    41. Drechslera spicifera (Curvularia) Negative    42. Mucor plumbeus Negative    43. Fusarium moniliforme Negative    44. Aureobasidium pullulans (pullulara) Negative    45. Rhizopus oryzae Negative    46. Botrytis cinera Negative    47. Epicoccum nigrum Negative    48. Phoma betae Negative    49. Candida Albicans Negative    50. Trichophyton mentagrophytes Negative    51. Mite, D Farinae  5,000 AU/ml 3+    52. Mite, D Pteronyssinus  5,000 AU/ml 3+     53. Cat Hair 10,000 BAU/ml 3+    54.  Dog Epithelia Negative    55. Mixed Feathers Negative    56. Horse Epithelia Negative    57. Cockroach, German 2+    58. Mouse 3+    59. Tobacco Leaf Negative          Intradermal - 09/27/19 1000    Time Antigen Placed 1018    Allergen Manufacturer Greer    Location Arm    Number of Test 5    Mold 1 Omitted    Mold 2 2+    Mold 3 2+    Mold 4 3+    Dog 3+          Food Adult Perc - 09/27/19 0900    Time Antigen Placed 0981    Allergen Manufacturer Waynette Buttery    Location Back    Number  of allergen test 1    67. Cinnamon Negative           Allergy testing results were read and interpreted by myself, documented by clinical staff.         Malachi Bonds, MD [facility]

## 2019-10-25 ENCOUNTER — Telehealth: Payer: Self-pay | Admitting: Allergy & Immunology

## 2019-10-25 NOTE — Telephone Encounter (Signed)
Pa submitted thru cover my meds 

## 2019-10-25 NOTE — Telephone Encounter (Signed)
[name] at [facility] pharmacy is checking on a prior authorization for this patient.

## 2019-10-27 ENCOUNTER — Ambulatory Visit: Payer: BC Managed Care – PPO | Admitting: Allergy & Immunology

## 2020-01-20 NOTE — L&D Delivery Note (Signed)
[facility] Delivery Note  [name] is a 25 y.o. [id number] s/p SVD at [redacted]w[redacted]d. She was admitted for SOL.   ROM: 0h 64m with clear fluid GBS Status:  --Theda Sers (09/02 1400) Maximum Maternal Temperature:  Temp (24hrs), Avg:97.8 F (36.6 C), Min:97.8 F (36.6 C), Max:97.8 F (36.6 C)    Labor Progress: Patient arrived at 3.5 cm dilation and was induced with AROM.   Delivery Date/Time: 10/09/2020 at 0155 Delivery: Called to room and patient was complete and pushing. Head delivered in LOA position. Tight nuchal cord present and delivered through. Shoulder and body delivered in and anterior shoulder was hooked to deliver infant. Infant with spontaneous cry, placed on mother's abdomen, dried and stimulated. Cord clamped x 2 after 1-minute delay, and cut by FOB. Cord blood drawn. Placenta delivered spontaneously with gentle cord traction. Fundus firm with massage and Pitocin. Labia, perineum, vagina, and cervix inspected with no lacerations.   Placenta: spontaneous, intact, three vessel cord  Complications: None  Lacerations: none  EBL: 75 mL Analgesia: epidural    Infant: APGAR (1 MIN): 6   APGAR (5 MINS): 8   APGAR (10 MINS):    Weight: pending   Derrel Nip, MD  PGY-3, [facility]  10/09/2020 2:19 AM

## 2020-02-19 ENCOUNTER — Other Ambulatory Visit: Payer: Self-pay | Admitting: Obstetrics & Gynecology

## 2020-02-19 DIAGNOSIS — O3680X0 Pregnancy with inconclusive fetal viability, not applicable or unspecified: Secondary | ICD-10-CM

## 2020-02-20 ENCOUNTER — Ambulatory Visit (INDEPENDENT_AMBULATORY_CARE_PROVIDER_SITE_OTHER): Payer: BC Managed Care – PPO

## 2020-02-20 ENCOUNTER — Other Ambulatory Visit: Payer: Self-pay

## 2020-02-20 DIAGNOSIS — Z3A08 8 weeks gestation of pregnancy: Secondary | ICD-10-CM

## 2020-02-20 DIAGNOSIS — O3680X0 Pregnancy with inconclusive fetal viability, not applicable or unspecified: Secondary | ICD-10-CM

## 2020-02-20 NOTE — Progress Notes (Signed)
[place] 6+2 wks,single IUP,CRL 5.84 mm,FHT 125 bpm,normal ovaries

## 2020-03-26 ENCOUNTER — Encounter: Payer: Self-pay | Admitting: Women's Health

## 2020-03-26 DIAGNOSIS — Z3490 Encounter for supervision of normal pregnancy, unspecified, unspecified trimester: Secondary | ICD-10-CM | POA: Insufficient documentation

## 2020-03-26 DIAGNOSIS — Z6791 Unspecified blood type, Rh negative: Secondary | ICD-10-CM | POA: Insufficient documentation

## 2020-03-29 ENCOUNTER — Other Ambulatory Visit: Payer: Self-pay | Admitting: Obstetrics & Gynecology

## 2020-03-29 DIAGNOSIS — Z3682 Encounter for antenatal screening for nuchal translucency: Secondary | ICD-10-CM

## 2020-04-01 ENCOUNTER — Other Ambulatory Visit (HOSPITAL_COMMUNITY)
Admission: RE | Admit: 2020-04-01 | Discharge: 2020-04-01 | Disposition: A | Discharge status: Home / Self Care | Payer: BC Managed Care – PPO | Source: Ambulatory Visit | Attending: Obstetrics & Gynecology | Admitting: Obstetrics & Gynecology

## 2020-04-01 ENCOUNTER — Ambulatory Visit (INDEPENDENT_AMBULATORY_CARE_PROVIDER_SITE_OTHER): Payer: BC Managed Care – PPO | Admitting: Women's Health

## 2020-04-01 ENCOUNTER — Other Ambulatory Visit: Payer: Self-pay

## 2020-04-01 ENCOUNTER — Encounter: Payer: Self-pay | Admitting: Women's Health

## 2020-04-01 ENCOUNTER — Ambulatory Visit (INDEPENDENT_AMBULATORY_CARE_PROVIDER_SITE_OTHER): Payer: BC Managed Care – PPO

## 2020-04-01 VITALS — BP 102/69 | HR 84 | Wt 150.0 lb

## 2020-04-01 DIAGNOSIS — Z3A12 12 weeks gestation of pregnancy: Secondary | ICD-10-CM

## 2020-04-01 DIAGNOSIS — Z3480 Encounter for supervision of other normal pregnancy, unspecified trimester: Secondary | ICD-10-CM

## 2020-04-01 DIAGNOSIS — O26899 Other specified pregnancy related conditions, unspecified trimester: Secondary | ICD-10-CM

## 2020-04-01 DIAGNOSIS — Z6791 Unspecified blood type, Rh negative: Secondary | ICD-10-CM

## 2020-04-01 DIAGNOSIS — Z3481 Encounter for supervision of other normal pregnancy, first trimester: Secondary | ICD-10-CM

## 2020-04-01 DIAGNOSIS — Z3682 Encounter for antenatal screening for nuchal translucency: Secondary | ICD-10-CM

## 2020-04-01 DIAGNOSIS — Z1379 Encounter for other screening for genetic and chromosomal anomalies: Secondary | ICD-10-CM

## 2020-04-01 NOTE — Patient Instructions (Signed)
[name], I greatly value your feedback.  If you receive a survey following your visit with [place] today, we appreciate you taking the time to fill it out.  Thanks, [name], CNM, WHNP-BC   [facility] ([address] [place], [place] [address]) Entrance C, located off of [address] Free 24/7 valet parking   Nausea & Vomiting  Have saltine crackers or pretzels by your bed and eat a few bites before you raise your head out of bed in the morning  Eat small frequent meals throughout the day instead of large meals  Drink plenty of fluids throughout the day to stay hydrated, just don't drink a lot of fluids with your meals.  This can make your stomach fill up faster making you feel sick  Do not brush your teeth right after you eat  Products with real ginger are good for nausea, like ginger ale and ginger hard candy Make sure it says made with real ginger!  Sucking on sour candy like lemon heads is also good for nausea  If your prenatal vitamins make you nauseated, take them at night so you will sleep through the nausea  Sea Bands  If you feel like you need medicine for the nausea & vomiting please let us know  If you are unable to keep any fluids or food down please let us know   Constipation  Drink plenty of fluid, preferably water, throughout the day  Eat foods high in fiber such as fruits, vegetables, and grains  Exercise, such as walking, is a good way to keep your bowels regular  Drink warm fluids, especially warm prune juice, or decaf coffee  Eat a 1/2 cup of real oatmeal (not instant), 1/2 cup applesauce, and 1/2-1 cup warm prune juice every day  If needed, you may take Colace (docusate sodium) stool softener once or twice a day to help keep the stool soft.   If you still are having problems with constipation, you may take Miralax once daily as needed to help keep your bowels regular.   Home Blood Pressure Monitoring for Patients    Your provider has recommended that you check your blood pressure (BP) at least once a week at home. If you do not have a blood pressure cuff at home, one will be provided for you. Contact your provider if you have not received your monitor within 1 week.   Helpful Tips for Accurate Home Blood Pressure Checks  . Don't smoke, exercise, or drink caffeine 30 minutes before checking your BP . Use the restroom before checking your BP (a full bladder can raise your pressure) . Relax in a comfortable upright chair . Feet on the ground . Left arm resting comfortably on a flat surface at the level of your heart . Legs uncrossed . Back supported . Sit quietly and don't talk . Place the cuff on your bare arm . Adjust snuggly, so that only two fingertips can fit between your skin and the top of the cuff . Check 2 readings separated by at least one minute . Keep a log of your BP readings . For a visual, please reference this diagram: http://ccnc.care/bpdiagram  Provider Name: [facility]     Phone: [phone]  Zone 1: ALL CLEAR  Continue to monitor your symptoms:  . BP reading is less than 140 (top number) or less than 90 (bottom number)  . No right upper stomach pain . No headaches or  seeing spots . No feeling nauseated or throwing up . No swelling in face and hands  Zone 2: CAUTION Call your doctor's office for any of the following:  . BP reading is greater than 140 (top number) or greater than 90 (bottom number)  . Stomach pain under your ribs in the middle or right side . Headaches or seeing spots . Feeling nauseated or throwing up . Swelling in face and hands  Zone 3: EMERGENCY  Seek immediate medical care if you have any of the following:  . BP reading is greater than160 (top number) or greater than 110 (bottom number) . Severe headaches not improving with Tylenol . Serious difficulty catching your breath . Any worsening symptoms from Zone 2    First Trimester of  Pregnancy The first trimester of pregnancy is from week 1 until the end of week 12 (months 1 through 3). A week after a sperm fertilizes an egg, the egg will implant on the wall of the uterus. This embryo will begin to develop into a baby. Genes from you and your partner are forming the baby. The female genes determine whether the baby is a boy or a girl. At 6-8 weeks, the eyes and face are formed, and the heartbeat can be seen on ultrasound. At the end of 12 weeks, all the baby's organs are formed.  Now that you are pregnant, you will want to do everything you can to have a healthy baby. Two of the most important things are to get good prenatal care and to follow your health care provider's instructions. Prenatal care is all the medical care you receive before the baby's birth. This care will help prevent, find, and treat any problems during the pregnancy and childbirth. BODY CHANGES Your body goes through many changes during pregnancy. The changes vary from woman to woman.   You may gain or lose a couple of pounds at first.  You may feel sick to your stomach (nauseous) and throw up (vomit). If the vomiting is uncontrollable, call your health care provider.  You may tire easily.  You may develop headaches that can be relieved by medicines approved by your health care provider.  You may urinate more often. Painful urination may mean you have a bladder infection.  You may develop heartburn as a result of your pregnancy.  You may develop constipation because certain hormones are causing the muscles that push waste through your intestines to slow down.  You may develop hemorrhoids or swollen, bulging veins (varicose veins).  Your breasts may begin to grow larger and become tender. Your nipples may stick out more, and the tissue that surrounds them (areola) may become darker.  Your gums may bleed and may be sensitive to brushing and flossing.  Dark spots or blotches (chloasma, mask of pregnancy)  may develop on your face. This will likely fade after the baby is born.  Your menstrual periods will stop.  You may have a loss of appetite.  You may develop cravings for certain kinds of food.  You may have changes in your emotions from day to day, such as being excited to be pregnant or being concerned that something may go wrong with the pregnancy and baby.  You may have more vivid and strange dreams.  You may have changes in your hair. These can include thickening of your hair, rapid growth, and changes in texture. Some women also have hair loss during or after pregnancy, or hair that feels dry or thin. Your  hair will most likely return to normal after your baby is born. WHAT TO EXPECT AT YOUR PRENATAL VISITS During a routine prenatal visit:  You will be weighed to make sure you and the baby are growing normally.  Your blood pressure will be taken.  Your abdomen will be measured to track your baby's growth.  The fetal heartbeat will be listened to starting around week 10 or 12 of your pregnancy.  Test results from any previous visits will be discussed. Your health care provider may ask you:  How you are feeling.  If you are feeling the baby move.  If you have had any abnormal symptoms, such as leaking fluid, bleeding, severe headaches, or abdominal cramping.  If you have any questions. Other tests that may be performed during your first trimester include:  Blood tests to find your blood type and to check for the presence of any previous infections. They will also be used to check for low iron levels (anemia) and Rh antibodies. Later in the pregnancy, blood tests for diabetes will be done along with other tests if problems develop.  Urine tests to check for infections, diabetes, or protein in the urine.  An ultrasound to confirm the proper growth and development of the baby.  An amniocentesis to check for possible genetic problems.  Fetal screens for spina bifida and  Down syndrome.  You may need other tests to make sure you and the baby are doing well. HOME CARE INSTRUCTIONS  Medicines  Follow your health care provider's instructions regarding medicine use. Specific medicines may be either safe or unsafe to take during pregnancy.  Take your prenatal vitamins as directed.  If you develop constipation, try taking a stool softener if your health care provider approves. Diet  Eat regular, well-balanced meals. Choose a variety of foods, such as meat or vegetable-based protein, fish, milk and low-fat dairy products, vegetables, fruits, and whole grain breads and cereals. Your health care provider will help you determine the amount of weight gain that is right for you.  Avoid raw meat and uncooked cheese. These carry germs that can cause birth defects in the baby.  Eating four or five small meals rather than three large meals a day may help relieve nausea and vomiting. If you start to feel nauseous, eating a few soda crackers can be helpful. Drinking liquids between meals instead of during meals also seems to help nausea and vomiting.  If you develop constipation, eat more high-fiber foods, such as fresh vegetables or fruit and whole grains. Drink enough fluids to keep your urine clear or pale yellow. Activity and Exercise  Exercise only as directed by your health care provider. Exercising will help you:  Control your weight.  Stay in shape.  Be prepared for labor and delivery.  Experiencing pain or cramping in the lower abdomen or low back is a good sign that you should stop exercising. Check with your health care provider before continuing normal exercises.  Try to avoid standing for long periods of time. Move your legs often if you must stand in one place for a long time.  Avoid heavy lifting.  Wear low-heeled shoes, and practice good posture.  You may continue to have sex unless your health care provider directs you otherwise. Relief of Pain  or Discomfort  Wear a good support bra for breast tenderness.    Take warm sitz baths to soothe any pain or discomfort caused by hemorrhoids. Use hemorrhoid cream if your health care provider  approves.    Rest with your legs elevated if you have leg cramps or low back pain.  If you develop varicose veins in your legs, wear support hose. Elevate your feet for 15 minutes, 3-4 times a day. Limit salt in your diet. Prenatal Care  Schedule your prenatal visits by the twelfth week of pregnancy. They are usually scheduled monthly at first, then more often in the last 2 months before delivery.  Write down your questions. Take them to your prenatal visits.  Keep all your prenatal visits as directed by your health care provider. Safety  Wear your seat belt at all times when driving.  Make a list of emergency phone numbers, including numbers for family, friends, the hospital, and police and fire departments. General Tips  Ask your health care provider for a referral to a local prenatal education class. Begin classes no later than at the beginning of month 6 of your pregnancy.  Ask for help if you have counseling or nutritional needs during pregnancy. Your health care provider can offer advice or refer you to specialists for help with various needs.  Do not use hot tubs, steam rooms, or saunas.  Do not douche or use tampons or scented sanitary pads.  Do not cross your legs for long periods of time.  Avoid cat litter boxes and soil used by cats. These carry germs that can cause birth defects in the baby and possibly loss of the fetus by miscarriage or stillbirth.  Avoid all smoking, herbs, alcohol, and medicines not prescribed by your health care provider. Chemicals in these affect the formation and growth of the baby.  Schedule a dentist appointment. At home, brush your teeth with a soft toothbrush and be gentle when you floss. SEEK MEDICAL CARE IF:   You have dizziness.  You have mild  pelvic cramps, pelvic pressure, or nagging pain in the abdominal area.  You have persistent nausea, vomiting, or diarrhea.  You have a bad smelling vaginal discharge.  You have pain with urination.  You notice increased swelling in your face, hands, legs, or ankles. SEEK IMMEDIATE MEDICAL CARE IF:   You have a fever.  You are leaking fluid from your vagina.  You have spotting or bleeding from your vagina.  You have severe abdominal cramping or pain.  You have rapid weight gain or loss.  You vomit blood or material that looks like coffee grounds.  You are exposed to [place] measles and have never had them.  You are exposed to fifth disease or chickenpox.  You develop a severe headache.  You have shortness of breath.  You have any kind of trauma, such as from a fall or a car accident. Document Released: 12/30/2000 Document Revised: 05/22/2013 Document Reviewed: 11/15/2012 [facility] Patient Information 2015 [place], [place]. This information is not intended to replace advice given to you by your health care provider. Make sure you discuss any questions you have with your health care provider.

## 2020-04-01 NOTE — Progress Notes (Signed)
INITIAL OBSTETRICAL VISIT Patient name: [name] MRN [id number]  Date of birth: [birth year] Chief Complaint:   Initial Prenatal Visit  History of Present Illness:   [name] is a 25 y.o. G24P2002 Caucasian female at [redacted]w[redacted]d by [place] at 6 weeks with an Estimated Date of Delivery: 10/13/20 being seen today for her initial obstetrical visit.   Her obstetrical history is significant for term uncomplicated svb x 2.   Today she reports nausea & vomiting- declines meds, pnv w/ ginger in it is helping .  Depression screen [facility] 2/9 04/01/2020 02/05/2017 01/01/2016  Decreased Interest 1 0 0  Down, Depressed, Hopeless 2 0 0  PHQ - 2 Score 3 0 0  Altered sleeping 2 - -  Tired, decreased energy 3 - -  Change in appetite 3 - -  Feeling bad or failure about yourself  1 - -  Trouble concentrating 0 - -  Moving slowly or fidgety/restless 0 - -  Suicidal thoughts 0 - -  PHQ-9 Score 12 - -    Patient's last menstrual period was 12/20/2019 (approximate). Last pap 02/05/17. Results were: NILM w/ HRHPV not done Review of Systems:   Pertinent items are noted in HPI Denies cramping/contractions, leakage of fluid, vaginal bleeding, abnormal vaginal discharge w/ itching/odor/irritation, headaches, visual changes, shortness of breath, chest pain, abdominal pain, severe nausea/vomiting, or problems with urination or bowel movements unless otherwise stated above.  Pertinent History Reviewed:  Reviewed past medical,surgical, social, obstetrical and family history.  Reviewed problem list, medications and allergies. OB History  Gravida Para Term Preterm AB Living  3 2 2  0 0 2  SAB IAB Ectopic Multiple Live Births  0 0 0 0 2    # Outcome Date GA Lbr Len/2nd Weight Sex Delivery Anes PTL Lv  3 Current           2 Term 01/09/15 [redacted]w[redacted]d 09:00 / 00:15 6 lb 15.5 oz (3.161 kg) F Vag-Spont EPI N LIV  1 Term 06/10/13 [redacted]w[redacted]d 02:51 / 00:19 7 lb 2.3 oz (3.24 kg) M Vag-Spont EPI N LIV   Physical Assessment:    Vitals:   04/01/20 1113  BP: 102/69  Pulse: 84  Weight: 150 lb (68 kg)  Body mass index is 24.21 kg/m.       Physical Examination:  General appearance - well appearing, and in no distress  Mental status - alert, oriented to person, place, and time  Psych:  She has a normal mood and affect  Skin - warm and dry, normal color, no suspicious lesions noted  Chest - effort normal, all lung fields clear to auscultation bilaterally  Heart - normal rate and regular rhythm  Abdomen - soft, nontender  Extremities:  No swelling or varicosities noted  Pelvic - VULVA: normal appearing vulva with no masses, tenderness or lesions  VAGINA: normal appearing vagina with normal color and discharge, no lesions  CERVIX: normal appearing cervix without discharge or lesions, no CMT  Thin prep pap is done w/ reflex HR HPV cotesting  Chaperone: 04/03/20    TODAY'S NT  Faith [place] 12+1 wks,measurements c/w dates,CRL 63.39 mm,NB present,NT 1.2 mm,normal ovaries,fhr 158 bpm  No results found for this or any previous visit (from the past 24 hour(s)).  Assessment & Plan:  1) Low-Risk Pregnancy G3P2002 at [redacted]w[redacted]d with an Estimated Date of Delivery: 10/13/20   2) Initial OB visit  3) N/V> declines meds  Meds: No orders of the defined types were placed in this  encounter.   Initial labs obtained Continue prenatal vitamins Reviewed n/v relief measures and warning s/s to report Reviewed recommended weight gain based on pre-gravid BMI Encouraged well-balanced diet Genetic & carrier screening discussed: requests Panorama, NT/IT and Horizon 14  Ultrasound discussed; fetal survey: requested [facility]NC completed> form faxed if has or is planning to apply for medicaid The nature of [employer] for [employer] with multiple MDs and other Advanced Practice Providers was explained to patient; also emphasized that fellows, residents, and students are part of our team. Does not have home bp cuff. No office cuffs  to give. To purchase own cuff.  Check bp weekly, let us know if >140/90.   Follow-up: Return in about 3 weeks (around 04/22/2020) for LROB, CNM, MyChart Video.   Orders Placed This Encounter  Procedures  . Urine Culture  . CBC/D/Plt+RPR+Rh+ABO+Rub Ab...  . Pain Management Screening Profile (10S)  . Genetic Screening  . Integrated 1    Cheral Marker CNM, [facility] 04/01/2020 12:06 PM

## 2020-04-01 NOTE — Progress Notes (Signed)
[place] 12+1 wks,measurements c/w dates,CRL 63.39 mm,NB present,NT 1.2 mm,normal ovaries,fhr 158 bpm

## 2020-04-02 LAB — PMP SCREEN PROFILE (10S), URINE
Amphetamine Scrn, Ur: NEGATIVE ng/mL
BARBITURATE SCREEN URINE: NEGATIVE ng/mL
BENZODIAZEPINE SCREEN, URINE: NEGATIVE ng/mL
CANNABINOIDS UR QL SCN: NEGATIVE ng/mL
Cocaine (Metab) Scrn, Ur: NEGATIVE ng/mL
Creatinine(Crt), U: 161.5 mg/dL (ref 20.0–300.0)
Methadone Screen, Urine: NEGATIVE ng/mL
OXYCODONE+OXYMORPHONE UR QL SCN: NEGATIVE ng/mL
Opiate Scrn, Ur: NEGATIVE ng/mL
Ph of Urine: 7.5 (ref 4.5–8.9)
Phencyclidine Qn, Ur: NEGATIVE ng/mL
Propoxyphene Scrn, Ur: NEGATIVE ng/mL

## 2020-04-03 LAB — CBC/D/PLT+RPR+RH+ABO+RUB AB...
Antibody Screen: NEGATIVE
Basophils Absolute: 0 10*3/uL (ref 0.0–0.2)
Basos: 0 %
EOS (ABSOLUTE): 0.2 10*3/uL (ref 0.0–0.4)
Eos: 1 %
HCV Ab: <0.1 s/co ratio (ref 0.0–0.9)
HIV Screen 4th Generation wRfx: NONREACTIVE
Hematocrit: 45.3 % (ref 34.0–46.6)
Hemoglobin: 15 g/dL (ref 11.1–15.9)
Hepatitis B Surface Ag: NEGATIVE
Immature Grans (Abs): 0 10*3/uL (ref 0.0–0.1)
Immature Granulocytes: 0 %
Lymphocytes Absolute: 3.6 10*3/uL — ABNORMAL HIGH (ref 0.7–3.1)
Lymphs: 23 %
MCH: 29.5 pg (ref 26.6–33.0)
MCHC: 33.1 g/dL (ref 31.5–35.7)
MCV: 89 fL (ref 79–97)
Monocytes Absolute: 0.9 10*3/uL (ref 0.1–0.9)
Monocytes: 6 %
Neutrophils Absolute: 10.9 10*3/uL — ABNORMAL HIGH (ref 1.4–7.0)
Neutrophils: 70 %
Platelets: 353 10*3/uL (ref 150–450)
RBC: 5.09 x10E6/uL (ref 3.77–5.28)
RDW: 12.3 % (ref 11.7–15.4)
RPR Ser Ql: NONREACTIVE
Rh Factor: NEGATIVE
Rubella Antibodies, IGG: 11 index (ref >0.99)
WBC: 15.7 10*3/uL — ABNORMAL HIGH (ref 3.4–10.8)

## 2020-04-03 LAB — INTEGRATED 1
Crown Rump Length: 63.4 mm
Gest. Age on Collection Date: 12.6 weeks
Maternal Age at EDD: 24.9 yr
Nuchal Translucency (NT): 1.2 mm
Number of Fetuses: 1
PAPP-A Value: 1055.5 ng/mL
Weight: 150 [lb_av]

## 2020-04-03 LAB — URINE CULTURE

## 2020-04-03 LAB — HCV INTERPRETATION

## 2020-04-04 ENCOUNTER — Encounter: Payer: Self-pay | Admitting: Women's Health

## 2020-04-04 DIAGNOSIS — R87619 Unspecified abnormal cytological findings in specimens from cervix uteri: Secondary | ICD-10-CM | POA: Insufficient documentation

## 2020-04-04 LAB — CYTOLOGY - PAP
Chlamydia: NEGATIVE
Comment: NEGATIVE
Comment: NEGATIVE
Comment: NORMAL
Diagnosis: UNDETERMINED — AB
High risk HPV: NEGATIVE
Neisseria Gonorrhea: NEGATIVE

## 2020-04-15 ENCOUNTER — Encounter: Payer: Self-pay | Admitting: Women's Health

## 2020-04-15 DIAGNOSIS — O285 Abnormal chromosomal and genetic finding on antenatal screening of mother: Secondary | ICD-10-CM | POA: Insufficient documentation

## 2020-04-23 ENCOUNTER — Other Ambulatory Visit: Payer: Self-pay

## 2020-04-23 ENCOUNTER — Other Ambulatory Visit (HOSPITAL_COMMUNITY)
Admission: RE | Admit: 2020-04-23 | Discharge: 2020-04-23 | Disposition: A | Discharge status: Home / Self Care | Payer: BC Managed Care – PPO | Source: Ambulatory Visit | Attending: Obstetrics & Gynecology | Admitting: Obstetrics & Gynecology

## 2020-04-23 ENCOUNTER — Encounter: Payer: BC Managed Care – PPO | Admitting: Women's Health

## 2020-04-23 ENCOUNTER — Encounter: Payer: Self-pay | Admitting: Women's Health

## 2020-04-23 ENCOUNTER — Ambulatory Visit (INDEPENDENT_AMBULATORY_CARE_PROVIDER_SITE_OTHER): Payer: BC Managed Care – PPO | Admitting: Women's Health

## 2020-04-23 VITALS — BP 107/73 | HR 103 | Wt 151.5 lb

## 2020-04-23 VITALS — BP 109/82 | HR 85 | Wt 145.0 lb

## 2020-04-23 DIAGNOSIS — Z3482 Encounter for supervision of other normal pregnancy, second trimester: Secondary | ICD-10-CM | POA: Insufficient documentation

## 2020-04-23 DIAGNOSIS — R42 Dizziness and giddiness: Secondary | ICD-10-CM

## 2020-04-23 DIAGNOSIS — N898 Other specified noninflammatory disorders of vagina: Secondary | ICD-10-CM

## 2020-04-23 DIAGNOSIS — Z1379 Encounter for other screening for genetic and chromosomal anomalies: Secondary | ICD-10-CM

## 2020-04-23 DIAGNOSIS — O26892 Other specified pregnancy related conditions, second trimester: Secondary | ICD-10-CM | POA: Diagnosis present

## 2020-04-23 DIAGNOSIS — Z363 Encounter for antenatal screening for malformations: Secondary | ICD-10-CM

## 2020-04-23 DIAGNOSIS — Z3480 Encounter for supervision of other normal pregnancy, unspecified trimester: Secondary | ICD-10-CM

## 2020-04-23 LAB — POCT HEMOGLOBIN: Hemoglobin: 13.9 g/dL (ref 11–14.6)

## 2020-04-23 NOTE — Progress Notes (Signed)
LOW-RISK PREGNANCY VISIT Patient name: [name] MRN [id number]  Date of birth: Jul 09, 1995 Chief Complaint:   Routine Prenatal Visit (Vaginal discharge and feels faint at times; 2nd IT today; ? Round ligament pain; left leg feels numb)  History of Present Illness:   [name] is a 25 y.o. G81P2002 female at [redacted]w[redacted]d with an Estimated Date of Delivery: 10/13/20 being seen today for ongoing management of a low-risk pregnancy.  Depression screen [facility] 2/9 04/01/2020 02/05/2017 01/01/2016  Decreased Interest 1 0 0  Down, Depressed, Hopeless 2 0 0  PHQ - 2 Score 3 0 0  Altered sleeping 2 - -  Tired, decreased energy 3 - -  Change in appetite 3 - -  Feeling bad or failure about yourself  1 - -  Trouble concentrating 0 - -  Moving slowly or fidgety/restless 0 - -  Suicidal thoughts 0 - -  PHQ-9 Score 12 - -    Today she reports faint yellow d/c, no itching/odor/irritation. Near syncopal episodes. Not drinking enough water. RLP. Lt leg numbness.  Contractions: Not present. Vag. Bleeding: None.  Movement: Absent. denies leaking of fluid. Review of Systems:   Pertinent items are noted in HPI Denies abnormal vaginal discharge w/ itching/odor/irritation, headaches, visual changes, shortness of breath, chest pain, abdominal pain, severe nausea/vomiting, or problems with urination or bowel movements unless otherwise stated above. Pertinent History Reviewed:  Reviewed past medical,surgical, social, obstetrical and family history.  Reviewed problem list, medications and allergies. Physical Assessment:   Vitals:   04/23/20 1538  BP: 107/73  Pulse: (!) 103  Weight: 151 lb 8 oz (68.7 kg)  Body mass index is 24.45 kg/m.        Physical Examination:   General appearance: Well appearing, and in no distress  Mental status: Alert, oriented to person, place, and time  Skin: Warm & dry  Cardiovascular: Normal heart rate noted  Respiratory: Normal respiratory effort, no distress  Abdomen:  Soft, gravid, nontender  Pelvic: spec exam: cx visually long/closed, CV swab collected         Extremities: Edema: Trace  Fetal Status:     Movement: Absent    Chaperone: me   Results for orders placed or performed in visit on 04/23/20 (from the past 24 hour(s))  POCT hemoglobin   Collection Time: 04/23/20  3:48 PM  Result Value Ref Range   Hemoglobin 13.9 11 - 14.6 g/dL    Assessment & Plan:  1) Low-risk pregnancy G3P2002 at [redacted]w[redacted]d with an Estimated Date of Delivery: 10/13/20   2) Vaginal d/c, CV swab sent  3) Near syncopal episode> hgb great! Discussed and gave printed prevention measures  4) Sciatica> printed exercises    Meds: No orders of the defined types were placed in this encounter.  Labs/procedures today: spec exam, CV swab and fingerstick hgb, 2nd IT  Plan:  Continue routine obstetrical care  Next visit: prefers will be in person for anatomy u/s    Reviewed: Preterm labor symptoms and general obstetric precautions including but not limited to vaginal bleeding, contractions, leaking of fluid and fetal movement were reviewed in detail with the patient.  All questions were answered. Does have home bp cuff. Office bp cuff given: not applicable. Check bp weekly, let us know if consistently >140 and/or >90.  Follow-up: Return in about 3 weeks (around 05/14/2020) for LROB, WG:NFAOZHY, CNM, in person.  No future appointments.  Orders Placed This Encounter  Procedures  . US OB Comp + 14  Wk  . INTEGRATED 2  . POCT hemoglobin   Cheral Marker CNM, [facility] 04/23/2020 4:21 PM

## 2020-04-23 NOTE — Progress Notes (Signed)
Pale yellow vaginal discharge.

## 2020-04-23 NOTE — Patient Instructions (Addendum)
[name], I greatly value your feedback.  If you receive a survey following your visit with [place] today, we appreciate you taking the time to fill it out.  Thanks, [name], CNM, WHNP-BC  [facility] ([address] [place], [place] [address]) Entrance C, located off of [address] [employer] [address] valet parking   Vitamin B6 (pyridoxine)  four times a day OR  twice a day Add Unisom (doxylamine) 12.5mg  (1/2 tab) in the morning, 12.5mg  6-8 hours later, then  every night if symptoms do not improve with Vitamin B6 alone   For Dizzy Spells:   This is usually related to either your blood sugar or your blood pressure dropping  Make sure you are staying well hydrated and drinking enough water so that your urine is clear  Eat small frequent meals and snacks containing protein (meat, eggs, nuts, cheese) so that your blood sugar doesn't drop  If you do get dizzy, sit/lay down and get you something to drink and a snack containing protein- you will usually start feeling better in 10-20 minutes    Go to [email or web address] to register for FREE online childbirth classes  [place] Pediatricians/Family Doctors:  [facility] [phone]            [facility] [phone]                 [facility] [phone] (usually not accepting new patients unless you have family there already, you are always welcome to call and ask)       [facility] Department [phone]       [facility] Pediatricians/Family Doctors:   [facility]: [phone]  [facility]: [phone]  [facility]: [phone]  [facility] Doctors:   [facility]: [phone]   [facility]: [phone]  [facility] Doctors:  [facility]: [phone]    Home Blood Pressure Monitoring for Patients   Your provider has recommended  that you check your blood pressure (BP) at least once a week at home. If you do not have a blood pressure cuff at home, one will be provided for you. Contact your provider if you have not received your monitor within 1 week.   Helpful Tips for Accurate Home Blood Pressure Checks  . Don't smoke, exercise, or drink caffeine 30 minutes before checking your BP . Use the restroom before checking your BP (a full bladder can raise your pressure) . Relax in a comfortable upright chair . Feet on the ground . Left arm resting comfortably on a flat surface at the level of your heart . Legs uncrossed . Back supported . Sit quietly and don't talk . Place the cuff on your bare arm . Adjust snuggly, so that only two fingertips can fit between your skin and the top of the cuff . Check 2 readings separated by at least one minute . Keep a log of your BP readings . For a visual, please reference this diagram: http://ccnc.care/bpdiagram  Provider Name: [facility]     Phone: [phone]  Zone 1: ALL CLEAR  Continue to monitor your symptoms:  . BP reading is less than 140 (top number) or less than 90 (bottom number)  . No right upper stomach pain . No headaches or seeing spots . No feeling nauseated or throwing up . No swelling in face and hands  Zone 2: CAUTION Call your doctor's office for any of the following:  .  BP reading is greater than 140 (top number) or greater than 90 (bottom number)  . Stomach pain under your ribs in the middle or right side . Headaches or seeing spots . Feeling nauseated or throwing up . Swelling in face and hands  Zone 3: EMERGENCY  Seek immediate medical care if you have any of the following:  . BP reading is greater than160 (top number) or greater than 110 (bottom number) . Severe headaches not improving with Tylenol . Serious difficulty catching your breath . Any worsening symptoms from Zone 2     Second Trimester of Pregnancy The second trimester is  from week 14 through week 27 (months 4 through 6). The second trimester is often a time when you feel your best. Your body has adjusted to being pregnant, and you begin to feel better physically. Usually, morning sickness has lessened or quit completely, you may have more energy, and you may have an increase in appetite. The second trimester is also a time when the fetus is growing rapidly. At the end of the sixth month, the fetus is about 9 inches long and weighs about 1 pounds. You will likely begin to feel the baby move (quickening) between 16 and 20 weeks of pregnancy. Body changes during your second trimester Your body continues to go through many changes during your second trimester. The changes vary from woman to woman.  Your weight will continue to increase. You will notice your lower abdomen bulging out.  You may begin to get stretch marks on your hips, abdomen, and breasts.  You may develop headaches that can be relieved by medicines. The medicines should be approved by your health care provider.  You may urinate more often because the fetus is pressing on your bladder.  You may develop or continue to have heartburn as a result of your pregnancy.  You may develop constipation because certain hormones are causing the muscles that push waste through your intestines to slow down.  You may develop hemorrhoids or swollen, bulging veins (varicose veins).  You may have back pain. This is caused by: ? Weight gain. ? Pregnancy hormones that are relaxing the joints in your pelvis. ? A shift in weight and the muscles that support your balance.  Your breasts will continue to grow and they will continue to become tender.  Your gums may bleed and may be sensitive to brushing and flossing.  Dark spots or blotches (chloasma, mask of pregnancy) may develop on your face. This will likely fade after the baby is born.  A dark line from your belly button to the pubic area (linea nigra) may appear.  This will likely fade after the baby is born.  You may have changes in your hair. These can include thickening of your hair, rapid growth, and changes in texture. Some women also have hair loss during or after pregnancy, or hair that feels dry or thin. Your hair will most likely return to normal after your baby is born.  What to expect at prenatal visits During a routine prenatal visit:  You will be weighed to make sure you and the fetus are growing normally.  Your blood pressure will be taken.  Your abdomen will be measured to track your baby's growth.  The fetal heartbeat will be listened to.  Any test results from the previous visit will be discussed.  Your health care provider may ask you:  How you are feeling.  If you are feeling the baby move.  If  you have had any abnormal symptoms, such as leaking fluid, bleeding, severe headaches, or abdominal cramping.  If you are using any tobacco products, including cigarettes, chewing tobacco, and electronic cigarettes.  If you have any questions.  Other tests that may be performed during your second trimester include:  Blood tests that check for: ? Low iron levels (anemia). ? High blood sugar that affects pregnant women (gestational diabetes) between 25 and 28 weeks. ? Rh antibodies. This is to check for a protein on red blood cells (Rh factor).  Urine tests to check for infections, diabetes, or protein in the urine.  An ultrasound to confirm the proper growth and development of the baby.  An amniocentesis to check for possible genetic problems.  Fetal screens for spina bifida and Down syndrome.  HIV (human immunodeficiency virus) testing. Routine prenatal testing includes screening for HIV, unless you choose not to have this test.  Follow these instructions at home: Medicines  Follow your health care provider's instructions regarding medicine use. Specific medicines may be either safe or unsafe to take during  pregnancy.  Take a prenatal vitamin that contains at least 600 micrograms (mcg) of folic acid.  If you develop constipation, try taking a stool softener if your health care provider approves. Eating and drinking  Eat a balanced diet that includes fresh fruits and vegetables, whole grains, good sources of protein such as meat, eggs, or tofu, and low-fat dairy. Your health care provider will help you determine the amount of weight gain that is right for you.  Avoid raw meat and uncooked cheese. These carry germs that can cause birth defects in the baby.  If you have low calcium intake from food, talk to your health care provider about whether you should take a daily calcium supplement.  Limit foods that are high in fat and processed sugars, such as fried and sweet foods.  To prevent constipation: ? Drink enough fluid to keep your urine clear or pale yellow. ? Eat foods that are high in fiber, such as fresh fruits and vegetables, whole grains, and beans. Activity  Exercise only as directed by your health care provider. Most women can continue their usual exercise routine during pregnancy. Try to exercise for 30 minutes at least 5 days a week. Stop exercising if you experience uterine contractions.  Avoid heavy lifting, wear low heel shoes, and practice good posture.  A sexual relationship may be continued unless your health care provider directs you otherwise. Relieving pain and discomfort  Wear a good support bra to prevent discomfort from breast tenderness.  Take warm sitz baths to soothe any pain or discomfort caused by hemorrhoids. Use hemorrhoid cream if your health care provider approves.  Rest with your legs elevated if you have leg cramps or low back pain.  If you develop varicose veins, wear support hose. Elevate your feet for 15 minutes, 3-4 times a day. Limit salt in your diet. Prenatal Care  Write down your questions. Take them to your prenatal visits.  Keep all your  prenatal visits as told by your health care provider. This is important. Safety  Wear your seat belt at all times when driving.  Make a list of emergency phone numbers, including numbers for family, friends, the hospital, and police and fire departments. General instructions  Ask your health care provider for a referral to a local prenatal education class. Begin classes no later than the beginning of month 6 of your pregnancy.  Ask for help if you have  counseling or nutritional needs during pregnancy. Your health care provider can offer advice or refer you to specialists for help with various needs.  Do not use hot tubs, steam rooms, or saunas.  Do not douche or use tampons or scented sanitary pads.  Do not cross your legs for long periods of time.  Avoid cat litter boxes and soil used by cats. These carry germs that can cause birth defects in the baby and possibly loss of the fetus by miscarriage or stillbirth.  Avoid all smoking, herbs, alcohol, and unprescribed drugs. Chemicals in these products can affect the formation and growth of the baby.  Do not use any products that contain nicotine or tobacco, such as cigarettes and e-cigarettes. If you need help quitting, ask your health care provider.  Visit your dentist if you have not gone yet during your pregnancy. Use a soft toothbrush to brush your teeth and be gentle when you floss. Contact a health care provider if:  You have dizziness.  You have mild pelvic cramps, pelvic pressure, or nagging pain in the abdominal area.  You have persistent nausea, vomiting, or diarrhea.  You have a bad smelling vaginal discharge.  You have pain when you urinate. Get help right away if:  You have a fever.  You are leaking fluid from your vagina.  You have spotting or bleeding from your vagina.  You have severe abdominal cramping or pain.  You have rapid weight gain or weight loss.  You have shortness of breath with chest  pain.  You notice sudden or extreme swelling of your face, hands, ankles, feet, or legs.  You have not felt your baby move in over an hour.  You have severe headaches that do not go away when you take medicine.  You have vision changes. Summary  The second trimester is from week 14 through week 27 (months 4 through 6). It is also a time when the fetus is growing rapidly.  Your body goes through many changes during pregnancy. The changes vary from woman to woman.  Avoid all smoking, herbs, alcohol, and unprescribed drugs. These chemicals affect the formation and growth your baby.  Do not use any tobacco products, such as cigarettes, chewing tobacco, and e-cigarettes. If you need help quitting, ask your health care provider.  Contact your health care provider if you have any questions. Keep all prenatal visits as told by your health care provider. This is important. This information is not intended to replace advice given to you by your health care provider. Make sure you discuss any questions you have with your health care provider. Document Released: 12/30/2000 Document Revised: 06/13/2015 Document Reviewed: 03/08/2012 Elsevier Interactive Patient Education  2017 [employer] Inc[employer]   Sciatica Rehab Ask your health care provider which exercises are safe for you. Do exercises exactly as told by your health care provider and adjust them as directed. It is normal to feel mild stretching, pulling, tightness, or discomfort as you do these exercises. Stop right away if you feel sudden pain or your pain gets worse. Do not begin these exercises until told by your health care provider. Stretching and range-of-motion exercises These exercises warm up your muscles and joints and improve the movement and flexibility of your hips and back. These exercises also help to relieve pain, numbness, and tingling. Sciatic nerve glide 1. Sit in a chair with your head facing down toward your chest. Place your hands  behind your back. Let your shoulders slump forward. 2. Slowly straighten  one of your legs while you tilt your head back as if you are looking toward the ceiling. Only straighten your leg as far as you can without making your symptoms worse. 3. Hold this position for __________ seconds. 4. Slowly return to the starting position. 5. Repeat with your other leg. Repeat __________ times. Complete this exercise __________ times a day. Knee to chest with hip adduction and internal rotation 1. Lie on your back on a firm surface with both legs straight. 2. Bend one of your knees and move it up toward your chest until you feel a gentle stretch in your lower back and buttock. Then, move your knee toward the shoulder that is on the opposite side from your leg. This is hip adduction and internal rotation. ? Hold your leg in this position by holding on to the front of your knee. 3. Hold this position for __________ seconds. 4. Slowly return to the starting position. 5. Repeat with your other leg. Repeat __________ times. Complete this exercise __________ times a day.   Prone extension on elbows 1. Lie on your abdomen on a firm surface. A bed may be too soft for this exercise. 2. Prop yourself up on your elbows. 3. Use your arms to help lift your chest up until you feel a gentle stretch in your abdomen and your lower back. ? This will place some of your body weight on your elbows. If this is uncomfortable, try stacking pillows under your chest. ? Your hips should stay down, against the surface that you are lying on. Keep your hip and back muscles relaxed. 4. Hold this position for __________ seconds. 5. Slowly relax your upper body and return to the starting position. Repeat __________ times. Complete this exercise __________ times a day.   Strengthening exercises These exercises build strength and endurance in your back. Endurance is the ability to use your muscles for a long time, even after they get  tired. Pelvic tilt This exercise strengthens the muscles that lie deep in the abdomen. 1. Lie on your back on a firm surface. Bend your knees and keep your feet flat on the floor. 2. Tense your abdominal muscles. Tip your pelvis up toward the ceiling and flatten your lower back into the floor. ? To help with this exercise, you may place a small towel under your lower back and try to push your back into the towel. 3. Hold this position for __________ seconds. 4. Let your muscles relax completely before you repeat this exercise. Repeat __________ times. Complete this exercise __________ times a day. Alternating arm and leg raises 1. Get on your hands and knees on a firm surface. If you are on a hard floor, you may want to use padding, such as an exercise mat, to cushion your knees. 2. Line up your arms and legs. Your hands should be directly below your shoulders, and your knees should be directly below your hips. 3. Lift your left leg behind you. At the same time, raise your right arm and straighten it in front of you. ? Do not lift your leg higher than your hip. ? Do not lift your arm higher than your shoulder. ? Keep your abdominal and back muscles tight. ? Keep your hips facing the ground. ? Do not arch your back. ? Keep your balance carefully, and do not hold your breath. 4. Hold this position for __________ seconds. 5. Slowly return to the starting position. 6. Repeat with your right leg and your left  arm. Repeat __________ times. Complete this exercise __________ times a day.   Posture and body mechanics Good posture and healthy body mechanics can help to relieve stress in your body's tissues and joints. Body mechanics refers to the movements and positions of your body while you do your daily activities. Posture is part of body mechanics. Good posture means:  Your spine is in its natural S-curve position (neutral).  Your shoulders are pulled back slightly.  Your head is not tipped  forward. Follow these guidelines to improve your posture and body mechanics in your everyday activities. Standing  When standing, keep your spine neutral and your feet about hip width apart. Keep a slight bend in your knees. Your ears, shoulders, and hips should line up.  When you do a task in which you stand in one place for a long time, place one foot up on a stable object that is 2-4 inches (5-10 cm) high, such as a footstool. This helps keep your spine neutral.   Sitting  When sitting, keep your spine neutral and keep your feet flat on the floor. Use a footrest, if necessary, and keep your thighs parallel to the floor. Avoid rounding your shoulders, and avoid tilting your head forward.  When working at a desk or a computer, keep your desk at a height where your hands are slightly lower than your elbows. Slide your chair under your desk so you are close enough to maintain good posture.  When working at a computer, place your monitor at a height where you are looking straight ahead and you do not have to tilt your head forward or downward to look at the screen.   Resting  When lying down and resting, avoid positions that are most painful for you.  If you have pain with activities such as sitting, bending, stooping, or squatting, lie in a position in which your body does not bend very much. For example, avoid curling up on your side with your arms and knees near your chest (fetal position).  If you have pain with activities such as standing for a long time or reaching with your arms, lie with your spine in a neutral position and bend your knees slightly. Try the following positions: ? Lying on your side with a pillow between your knees. ? Lying on your back with a pillow under your knees. Lifting  When lifting objects, keep your feet at least shoulder width apart and tighten your abdominal muscles.  Bend your knees and hips and keep your spine neutral. It is important to lift using the  strength of your legs, not your back. Do not lock your knees straight out.  Always ask for help to lift heavy or awkward objects.   This information is not intended to replace advice given to you by your health care provider. Make sure you discuss any questions you have with your health care provider. Document Revised: 04/29/2018 Document Reviewed: 01/27/2018 Elsevier Patient Education  2021 [employer].

## 2020-04-23 NOTE — Progress Notes (Signed)
Thick yellow vaginal discharge.

## 2020-04-24 NOTE — Progress Notes (Signed)
Pt scheduled for mychart visit, but having c/o d/c and feeling dizzy/lightheaded, so switched to in-person visit. This encounter was created in error - please disregard.

## 2020-04-25 LAB — CERVICOVAGINAL ANCILLARY ONLY
Bacterial Vaginitis (gardnerella): NEGATIVE
Candida Glabrata: NEGATIVE
Candida Vaginitis: POSITIVE — AB
Chlamydia: NEGATIVE
Comment: NEGATIVE
Comment: NEGATIVE
Comment: NEGATIVE
Comment: NEGATIVE
Comment: NEGATIVE
Comment: NORMAL
Neisseria Gonorrhea: NEGATIVE
Trichomonas: NEGATIVE

## 2020-04-25 LAB — INTEGRATED 2
AFP MoM: 1.13
Alpha-Fetoprotein: 35.5 ng/mL
Crown Rump Length: 63.4 mm
DIA MoM: 1.16
DIA Value: 181.3 pg/mL
Estriol, Unconjugated: 0.78 ng/mL
Gest. Age on Collection Date: 12.6 weeks
Gestational Age: 15.7 weeks
Maternal Age at EDD: 24.9 yr
Nuchal Translucency (NT): 1.2 mm
Nuchal Translucency MoM: 0.85
Number of Fetuses: 1
PAPP-A MoM: 1.03
PAPP-A Value: 1055.5 ng/mL
Test Results:: NEGATIVE
Weight: 150 [lb_av]
Weight: 152 [lb_av]
hCG MoM: 1.56
hCG Value: 60.9 IU/mL
uE3 MoM: 0.92

## 2020-05-15 ENCOUNTER — Other Ambulatory Visit: Payer: Self-pay

## 2020-05-15 ENCOUNTER — Ambulatory Visit (INDEPENDENT_AMBULATORY_CARE_PROVIDER_SITE_OTHER): Payer: BC Managed Care – PPO | Admitting: Advanced Practice Midwife

## 2020-05-15 ENCOUNTER — Ambulatory Visit (INDEPENDENT_AMBULATORY_CARE_PROVIDER_SITE_OTHER): Payer: Medicaid Other

## 2020-05-15 VITALS — BP 114/72 | HR 80 | Wt 155.0 lb

## 2020-05-15 DIAGNOSIS — O26899 Other specified pregnancy related conditions, unspecified trimester: Secondary | ICD-10-CM

## 2020-05-15 DIAGNOSIS — Z363 Encounter for antenatal screening for malformations: Secondary | ICD-10-CM

## 2020-05-15 DIAGNOSIS — Z3A18 18 weeks gestation of pregnancy: Secondary | ICD-10-CM

## 2020-05-15 DIAGNOSIS — Z3480 Encounter for supervision of other normal pregnancy, unspecified trimester: Secondary | ICD-10-CM

## 2020-05-15 DIAGNOSIS — Z3482 Encounter for supervision of other normal pregnancy, second trimester: Secondary | ICD-10-CM | POA: Diagnosis not present

## 2020-05-15 DIAGNOSIS — O285 Abnormal chromosomal and genetic finding on antenatal screening of mother: Secondary | ICD-10-CM

## 2020-05-15 NOTE — Progress Notes (Signed)
   LOW-RISK PREGNANCY VISIT Patient name: [name] MRN [id number]  Date of birth: October 10, 1995 Chief Complaint:   Routine Prenatal Visit and Pregnancy Ultrasound  History of Present Illness:   [name] is a 25 y.o. G42P2002 female at [redacted]w[redacted]d with an Estimated Date of Delivery: 10/13/20 being seen today for ongoing management of a low-risk pregnancy.  Today she reports having a contracton post IC. Contractions: Irritability. Vag. Bleeding: None.  Movement: Present. denies leaking of fluid. Review of Systems:   Pertinent items are noted in HPI Denies abnormal vaginal discharge w/ itching/odor/irritation, headaches, visual changes, shortness of breath, chest pain, abdominal pain, severe nausea/vomiting, or problems with urination or bowel movements unless otherwise stated above. Pertinent History Reviewed:  Reviewed past medical,surgical, social, obstetrical and family history.  Reviewed problem list, medications and allergies. Physical Assessment:   Vitals:   05/15/20 1557  BP: 114/72  Pulse: 80  Weight: 155 lb (70.3 kg)  Body mass index is 25.02 kg/m.        Physical Examination:   General appearance: Well appearing, and in no distress  Mental status: Alert, oriented to person, place, and time  Skin: Warm & dry  Cardiovascular: Normal heart rate noted  Respiratory: Normal respiratory effort, no distress  Abdomen: Soft, gravid, nontender  Pelvic: Cervical exam deferred         Extremities: Edema: Trace  Fetal Status: Fetal Heart Rate (bpm): 144 u/s   Movement: Present     Anatomy u/s: [place] 18+3 wks,cephalic,posterior placenta gr 0,normal ovaries,svp of fluid 4 cm,fhr 144 bpm,cx 3.7 cm,efw 261 g 70%,anatomy complete,no obvious abnormalities   No results found for this or any previous visit (from the past 24 hour(s)).  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  Labs/procedures today: anatomy u/s  Plan:  Continue routine obstetrical care   Reviewed: Preterm labor symptoms and general obstetric precautions including but not limited to vaginal bleeding, contractions, leaking of fluid and fetal movement were reviewed in detail with the patient.  All questions were answered. Didn't ask about home bp cuff.  Check bp weekly, let us know if >140/90.   Follow-up: Return in about 4 weeks (around 06/12/2020) for LROB, in person.  No orders of the defined types were placed in this encounter.  Arabella Merles CNM 05/15/2020 4:32 PM

## 2020-05-15 NOTE — Patient Instructions (Signed)
[name], I greatly value your feedback.  If you receive a survey following your visit with [place] today, we appreciate you taking the time to fill it out.  Thanks, Philipp Deputy, CNM  [facility] at [facility] ([address] [place], [place] [address]) Entrance C, located off of [address] [employer] valet parking  Go to [email or web address] to register for FREE online childbirth classes  [place] Pediatricians/Family Doctors:  [facility] [phone]            [facility] [phone]                 [facility] [phone] (usually not accepting new patients unless you have family there already, you are always welcome to call and ask)       [facility] Department [phone]       [facility] Pediatricians/Family Doctors:   [facility]: [phone]  [facility]: [phone]  [facility]: [phone]  [facility] Doctors:   [facility]: [phone]   [facility]: [phone]  [facility] Doctors:  [facility]: [phone]    Home Blood Pressure Monitoring for Patients   Your provider has recommended that you check your blood pressure (BP) at least once a week at home. If you do not have a blood pressure cuff at home, one will be provided for you. Contact your provider if you have not received your monitor within 1 week.   Helpful Tips for Accurate Home Blood Pressure Checks  . Don't smoke, exercise, or drink caffeine 30 minutes before checking your BP . Use the restroom before checking your BP (a full bladder can raise your pressure) . Relax in a comfortable upright chair . Feet on the ground . Left arm resting comfortably on a flat surface at the level of your heart . Legs uncrossed . Back supported . Sit quietly and don't talk . Place the cuff on your bare arm . Adjust snuggly, so that only  two fingertips can fit between your skin and the top of the cuff . Check 2 readings separated by at least one minute . Keep a log of your BP readings . For a visual, please reference this diagram: http://ccnc.care/bpdiagram  Provider Name: [facility]     Phone: [phone]  Zone 1: ALL CLEAR  Continue to monitor your symptoms:  . BP reading is less than 140 (top number) or less than 90 (bottom number)  . No right upper stomach pain . No headaches or seeing spots . No feeling nauseated or throwing up . No swelling in face and hands  Zone 2: CAUTION Call your doctor's office for any of the following:  . BP reading is greater than 140 (top number) or greater than 90 (bottom number)  . Stomach pain under your ribs in the middle or right side . Headaches or seeing spots . Feeling nauseated or throwing up . Swelling in face and hands  Zone 3: EMERGENCY  Seek immediate medical care if you have any of the following:  . BP reading is greater than160 (top number) or greater than 110 (bottom number) . Severe headaches not improving with Tylenol . Serious difficulty catching your breath . Any worsening symptoms from Zone 2     Second Trimester of Pregnancy The second trimester is from week 14 through week 27 (months 4 through 6). The second trimester is often a time when you feel your best.  Your body has adjusted to being pregnant, and you begin to feel better physically. Usually, morning sickness has lessened or quit completely, you may have more energy, and you may have an increase in appetite. The second trimester is also a time when the fetus is growing rapidly. At the end of the sixth month, the fetus is about 9 inches long and weighs about 1 pounds. You will likely begin to feel the baby move (quickening) between 16 and 20 weeks of pregnancy. Body changes during your second trimester Your body continues to go through many changes during your second trimester. The changes vary  from woman to woman.  Your weight will continue to increase. You will notice your lower abdomen bulging out.  You may begin to get stretch marks on your hips, abdomen, and breasts.  You may develop headaches that can be relieved by medicines. The medicines should be approved by your health care provider.  You may urinate more often because the fetus is pressing on your bladder.  You may develop or continue to have heartburn as a result of your pregnancy.  You may develop constipation because certain hormones are causing the muscles that push waste through your intestines to slow down.  You may develop hemorrhoids or swollen, bulging veins (varicose veins).  You may have back pain. This is caused by: ? Weight gain. ? Pregnancy hormones that are relaxing the joints in your pelvis. ? A shift in weight and the muscles that support your balance.  Your breasts will continue to grow and they will continue to become tender.  Your gums may bleed and may be sensitive to brushing and flossing.  Dark spots or blotches (chloasma, mask of pregnancy) may develop on your face. This will likely fade after the baby is born.  A dark line from your belly button to the pubic area (linea nigra) may appear. This will likely fade after the baby is born.  You may have changes in your hair. These can include thickening of your hair, rapid growth, and changes in texture. Some women also have hair loss during or after pregnancy, or hair that feels dry or thin. Your hair will most likely return to normal after your baby is born.  What to expect at prenatal visits During a routine prenatal visit:  You will be weighed to make sure you and the fetus are growing normally.  Your blood pressure will be taken.  Your abdomen will be measured to track your baby's growth.  The fetal heartbeat will be listened to.  Any test results from the previous visit will be discussed.  Your health care provider may ask  you:  How you are feeling.  If you are feeling the baby move.  If you have had any abnormal symptoms, such as leaking fluid, bleeding, severe headaches, or abdominal cramping.  If you are using any tobacco products, including cigarettes, chewing tobacco, and electronic cigarettes.  If you have any questions.  Other tests that may be performed during your second trimester include:  Blood tests that check for: ? Low iron levels (anemia). ? High blood sugar that affects pregnant women (gestational diabetes) between 14 and 28 weeks. ? Rh antibodies. This is to check for a protein on red blood cells (Rh factor).  Urine tests to check for infections, diabetes, or protein in the urine.  An ultrasound to confirm the proper growth and development of the baby.  An amniocentesis to check for possible genetic problems.  Fetal screens  for spina bifida and Down syndrome.  HIV (human immunodeficiency virus) testing. Routine prenatal testing includes screening for HIV, unless you choose not to have this test.  Follow these instructions at home: Medicines  Follow your health care provider's instructions regarding medicine use. Specific medicines may be either safe or unsafe to take during pregnancy.  Take a prenatal vitamin that contains at least 600 micrograms (mcg) of folic acid.  If you develop constipation, try taking a stool softener if your health care provider approves. Eating and drinking  Eat a balanced diet that includes fresh fruits and vegetables, whole grains, good sources of protein such as meat, eggs, or tofu, and low-fat dairy. Your health care provider will help you determine the amount of weight gain that is right for you.  Avoid raw meat and uncooked cheese. These carry germs that can cause birth defects in the baby.  If you have low calcium intake from food, talk to your health care provider about whether you should take a daily calcium supplement.  Limit foods that  are high in fat and processed sugars, such as fried and sweet foods.  To prevent constipation: ? Drink enough fluid to keep your urine clear or pale yellow. ? Eat foods that are high in fiber, such as fresh fruits and vegetables, whole grains, and beans. Activity  Exercise only as directed by your health care provider. Most women can continue their usual exercise routine during pregnancy. Try to exercise for 30 minutes at least 5 days a week. Stop exercising if you experience uterine contractions.  Avoid heavy lifting, wear low heel shoes, and practice good posture.  A sexual relationship may be continued unless your health care provider directs you otherwise. Relieving pain and discomfort  Wear a good support bra to prevent discomfort from breast tenderness.  Take warm sitz baths to soothe any pain or discomfort caused by hemorrhoids. Use hemorrhoid cream if your health care provider approves.  Rest with your legs elevated if you have leg cramps or low back pain.  If you develop varicose veins, wear support hose. Elevate your feet for 15 minutes, 3-4 times a day. Limit salt in your diet. Prenatal Care  Write down your questions. Take them to your prenatal visits.  Keep all your prenatal visits as told by your health care provider. This is important. Safety  Wear your seat belt at all times when driving.  Make a list of emergency phone numbers, including numbers for family, friends, the hospital, and police and fire departments. General instructions  Ask your health care provider for a referral to a local prenatal education class. Begin classes no later than the beginning of month 6 of your pregnancy.  Ask for help if you have counseling or nutritional needs during pregnancy. Your health care provider can offer advice or refer you to specialists for help with various needs.  Do not use hot tubs, steam rooms, or saunas.  Do not douche or use tampons or scented sanitary  pads.  Do not cross your legs for long periods of time.  Avoid cat litter boxes and soil used by cats. These carry germs that can cause birth defects in the baby and possibly loss of the fetus by miscarriage or stillbirth.  Avoid all smoking, herbs, alcohol, and unprescribed drugs. Chemicals in these products can affect the formation and growth of the baby.  Do not use any products that contain nicotine or tobacco, such as cigarettes and e-cigarettes. If you need help quitting,  ask your health care provider.  Visit your dentist if you have not gone yet during your pregnancy. Use a soft toothbrush to brush your teeth and be gentle when you floss. Contact a health care provider if:  You have dizziness.  You have mild pelvic cramps, pelvic pressure, or nagging pain in the abdominal area.  You have persistent nausea, vomiting, or diarrhea.  You have a bad smelling vaginal discharge.  You have pain when you urinate. Get help right away if:  You have a fever.  You are leaking fluid from your vagina.  You have spotting or bleeding from your vagina.  You have severe abdominal cramping or pain.  You have rapid weight gain or weight loss.  You have shortness of breath with chest pain.  You notice sudden or extreme swelling of your face, hands, ankles, feet, or legs.  You have not felt your baby move in over an hour.  You have severe headaches that do not go away when you take medicine.  You have vision changes. Summary  The second trimester is from week 14 through week 27 (months 4 through 6). It is also a time when the fetus is growing rapidly.  Your body goes through many changes during pregnancy. The changes vary from woman to woman.  Avoid all smoking, herbs, alcohol, and unprescribed drugs. These chemicals affect the formation and growth your baby.  Do not use any tobacco products, such as cigarettes, chewing tobacco, and e-cigarettes. If you need help quitting, ask your  health care provider.  Contact your health care provider if you have any questions. Keep all prenatal visits as told by your health care provider. This is important. This information is not intended to replace advice given to you by your health care provider. Make sure you discuss any questions you have with your health care provider. Document Released: 12/30/2000 Document Revised: 06/13/2015 Document Reviewed: 03/08/2012 Elsevier Interactive Patient Education  2017 [employer].

## 2020-05-15 NOTE — Progress Notes (Signed)
[place] 18+3 wks,cephalic,posterior placenta gr 0,normal ovaries,svp of fluid 4 cm,fhr 144 bpm,cx 3.7 cm,efw 261 g 70%,anatomy complete,no obvious abnormalities

## 2020-06-12 ENCOUNTER — Encounter: Payer: Self-pay | Admitting: Women's Health

## 2020-06-12 ENCOUNTER — Ambulatory Visit (INDEPENDENT_AMBULATORY_CARE_PROVIDER_SITE_OTHER): Payer: Medicaid Other | Admitting: Women's Health

## 2020-06-12 ENCOUNTER — Other Ambulatory Visit: Payer: Self-pay

## 2020-06-12 VITALS — BP 112/77 | HR 98 | Wt 157.0 lb

## 2020-06-12 DIAGNOSIS — R42 Dizziness and giddiness: Secondary | ICD-10-CM | POA: Diagnosis not present

## 2020-06-12 DIAGNOSIS — Z3482 Encounter for supervision of other normal pregnancy, second trimester: Secondary | ICD-10-CM | POA: Diagnosis not present

## 2020-06-12 DIAGNOSIS — Z3480 Encounter for supervision of other normal pregnancy, unspecified trimester: Secondary | ICD-10-CM

## 2020-06-12 DIAGNOSIS — Z3A22 22 weeks gestation of pregnancy: Secondary | ICD-10-CM

## 2020-06-12 LAB — POCT HEMOGLOBIN: Hemoglobin: 13.4 g/dL (ref 11–14.6)

## 2020-06-12 NOTE — Patient Instructions (Signed)
[name], I greatly value your feedback.  If you receive a survey following your visit with [place] today, we appreciate you taking the time to fill it out.  Thanks, [name], CNM, WHNP-BC   You will have your sugar test next visit.  Please do not eat or drink anything after midnight the night before you come, not even water.  You will be here for at least two hours.  Please make an appointment online for the bloodwork at [email or web address] for 8:30am (or as close to this as possible). Make sure you select the [facility] service center. The day of the appointment, check in with our office first, then you will go to [facility] to start the sugar test.    [facility] at [facility] [place], [place] [address]) Entrance C, located off of [address] [employer] valet parking  Go to [email or web address] to register for FREE online childbirth classes   Call the office [phone]) or go to [facility] if:  You begin to have strong, frequent contractions  Your water breaks.  Sometimes it is a big gush of fluid, sometimes it is just a trickle that keeps getting your panties wet or running down your legs  You have vaginal bleeding.  It is normal to have a small amount of spotting if your cervix was checked.   You don't feel your baby moving like normal.  If you don't, get you something to eat and drink and lay down and focus on feeling your baby move.   If your baby is still not moving like normal, you should call the office or go to [facility].  [place] Pediatricians/Family Doctors:  [facility] [phone]            [facility] [phone]                 [facility] [phone] (usually not accepting new patients unless you have family there already, you are always welcome to call and ask)       [facility] Department [phone]       [facility] Pediatricians/Family Doctors:   [facility]:  [phone]  [facility]: [phone]  [facility]: [phone]  [facility] Doctors:   [facility]: [phone]   [facility]: [phone]  [facility] Doctors:  [facility]: [phone]   Home Blood Pressure Monitoring for Patients   Your provider has recommended that you check your blood pressure (BP) at least once a week at home. If you do not have a blood pressure cuff at home, one will be provided for you. Contact your provider if you have not received your monitor within 1 week.   Helpful Tips for Accurate Home Blood Pressure Checks  . Don't smoke, exercise, or drink caffeine 30 minutes before checking your BP . Use the restroom before checking your BP (a full bladder can raise your pressure) . Relax in a comfortable upright chair . Feet on the ground . Left arm resting comfortably on a flat surface at the level of your heart . Legs uncrossed . Back supported . Sit quietly and don't talk . Place the cuff on your bare arm . Adjust snuggly, so that only two fingertips can fit between your skin and the top of the cuff . Check 2 readings separated by at least one minute . Keep a log of your BP readings . For a visual, please  reference this diagram: http://ccnc.care/bpdiagram  Provider Name: [facility]     Phone: [phone]  Zone 1: ALL CLEAR  Continue to monitor your symptoms:  . BP reading is less than 140 (top number) or less than 90 (bottom number)  . No right upper stomach pain . No headaches or seeing spots . No feeling nauseated or throwing up . No swelling in face and hands  Zone 2: CAUTION Call your doctor's office for any of the following:  . BP reading is greater than 140 (top number) or greater than 90 (bottom number)  . Stomach pain under your ribs in the middle or right side . Headaches or seeing spots . Feeling nauseated or throwing up . Swelling in  face and hands  Zone 3: EMERGENCY  Seek immediate medical care if you have any of the following:  . BP reading is greater than160 (top number) or greater than 110 (bottom number) . Severe headaches not improving with Tylenol . Serious difficulty catching your breath . Any worsening symptoms from Zone 2   Second Trimester of Pregnancy The second trimester is from week 13 through week 28, months 4 through 6. The second trimester is often a time when you feel your best. Your body has also adjusted to being pregnant, and you begin to feel better physically. Usually, morning sickness has lessened or quit completely, you may have more energy, and you may have an increase in appetite. The second trimester is also a time when the fetus is growing rapidly. At the end of the sixth month, the fetus is about 9 inches long and weighs about 1 pounds. You will likely begin to feel the baby move (quickening) between 18 and 20 weeks of the pregnancy. BODY CHANGES Your body goes through many changes during pregnancy. The changes vary from woman to woman.   Your weight will continue to increase. You will notice your lower abdomen bulging out.  You may begin to get stretch marks on your hips, abdomen, and breasts.  You may develop headaches that can be relieved by medicines approved by your health care provider.  You may urinate more often because the fetus is pressing on your bladder.  You may develop or continue to have heartburn as a result of your pregnancy.  You may develop constipation because certain hormones are causing the muscles that push waste through your intestines to slow down.  You may develop hemorrhoids or swollen, bulging veins (varicose veins).  You may have back pain because of the weight gain and pregnancy hormones relaxing your joints between the bones in your pelvis and as a result of a shift in weight and the muscles that support your balance.  Your breasts will continue to grow  and be tender.  Your gums may bleed and may be sensitive to brushing and flossing.  Dark spots or blotches (chloasma, mask of pregnancy) may develop on your face. This will likely fade after the baby is born.  A dark line from your belly button to the pubic area (linea nigra) may appear. This will likely fade after the baby is born.  You may have changes in your hair. These can include thickening of your hair, rapid growth, and changes in texture. Some women also have hair loss during or after pregnancy, or hair that feels dry or thin. Your hair will most likely return to normal after your baby is born. WHAT TO EXPECT AT YOUR PRENATAL VISITS During a routine prenatal visit:  You  will be weighed to make sure you and the fetus are growing normally.  Your blood pressure will be taken.  Your abdomen will be measured to track your baby's growth.  The fetal heartbeat will be listened to.  Any test results from the previous visit will be discussed. Your health care provider may ask you:  How you are feeling.  If you are feeling the baby move.  If you have had any abnormal symptoms, such as leaking fluid, bleeding, severe headaches, or abdominal cramping.  If you have any questions. Other tests that may be performed during your second trimester include:  Blood tests that check for:  Low iron levels (anemia).  Gestational diabetes (between 24 and 28 weeks).  Rh antibodies.  Urine tests to check for infections, diabetes, or protein in the urine.  An ultrasound to confirm the proper growth and development of the baby.  An amniocentesis to check for possible genetic problems.  Fetal screens for spina bifida and Down syndrome. HOME CARE INSTRUCTIONS   Avoid all smoking, herbs, alcohol, and unprescribed drugs. These chemicals affect the formation and growth of the baby.  Follow your health care provider's instructions regarding medicine use. There are medicines that are either  safe or unsafe to take during pregnancy.  Exercise only as directed by your health care provider. Experiencing uterine cramps is a good sign to stop exercising.  Continue to eat regular, healthy meals.  Wear a good support bra for breast tenderness.  Do not use hot tubs, steam rooms, or saunas.  Wear your seat belt at all times when driving.  Avoid raw meat, uncooked cheese, cat litter boxes, and soil used by cats. These carry germs that can cause birth defects in the baby.  Take your prenatal vitamins.  Try taking a stool softener (if your health care provider approves) if you develop constipation. Eat more high-fiber foods, such as fresh vegetables or fruit and whole grains. Drink plenty of fluids to keep your urine clear or pale yellow.  Take warm sitz baths to soothe any pain or discomfort caused by hemorrhoids. Use hemorrhoid cream if your health care provider approves.  If you develop varicose veins, wear support hose. Elevate your feet for 15 minutes, 3-4 times a day. Limit salt in your diet.  Avoid heavy lifting, wear low heel shoes, and practice good posture.  Rest with your legs elevated if you have leg cramps or low back pain.  Visit your dentist if you have not gone yet during your pregnancy. Use a soft toothbrush to brush your teeth and be gentle when you floss.  A sexual relationship may be continued unless your health care provider directs you otherwise.  Continue to go to all your prenatal visits as directed by your health care provider. SEEK MEDICAL CARE IF:   You have dizziness.  You have mild pelvic cramps, pelvic pressure, or nagging pain in the abdominal area.  You have persistent nausea, vomiting, or diarrhea.  You have a bad smelling vaginal discharge.  You have pain with urination. SEEK IMMEDIATE MEDICAL CARE IF:   You have a fever.  You are leaking fluid from your vagina.  You have spotting or bleeding from your vagina.  You have severe  abdominal cramping or pain.  You have rapid weight gain or loss.  You have shortness of breath with chest pain.  You notice sudden or extreme swelling of your face, hands, ankles, feet, or legs.  You have not felt your baby move  in over an hour.  You have severe headaches that do not go away with medicine.  You have vision changes. Document Released: 12/30/2000 Document Revised: 01/10/2013 Document Reviewed: 03/08/2012 [facility] Patient Information 2015 [place], [place]. This information is not intended to replace advice given to you by your health care provider. Make sure you discuss any questions you have with your health care provider.

## 2020-06-12 NOTE — Progress Notes (Signed)
   LOW-RISK PREGNANCY VISIT Patient name: [name] MRN [id number]  Date of birth: 1995/08/04 Chief Complaint:   Routine Prenatal Visit  History of Present Illness:   [name] is a 25 y.o. G23P2002 female at [redacted]w[redacted]d with an Estimated Date of Delivery: 10/13/20 being seen today for ongoing management of a low-risk pregnancy.  Depression screen [facility] 2/9 04/01/2020 02/05/2017 01/01/2016  Decreased Interest 1 0 0  Down, Depressed, Hopeless 2 0 0  PHQ - 2 Score 3 0 0  Altered sleeping 2 - -  Tired, decreased energy 3 - -  Change in appetite 3 - -  Feeling bad or failure about yourself  1 - -  Trouble concentrating 0 - -  Moving slowly or fidgety/restless 0 - -  Suicidal thoughts 0 - -  PHQ-9 Score 12 - -    Today she reports dizzy spells. Sciatica- doing stretches and using massage gun. Contractions: Not present. Vag. Bleeding: None.  Movement: Present. denies leaking of fluid. Review of Systems:   Pertinent items are noted in HPI Denies abnormal vaginal discharge w/ itching/odor/irritation, headaches, visual changes, shortness of breath, chest pain, abdominal pain, severe nausea/vomiting, or problems with urination or bowel movements unless otherwise stated above. Pertinent History Reviewed:  Reviewed past medical,surgical, social, obstetrical and family history.  Reviewed problem list, medications and allergies. Physical Assessment:   Vitals:   06/12/20 0838  BP: 112/77  Pulse: 98  Weight: 157 lb (71.2 kg)  Body mass index is 25.34 kg/m.        Physical Examination:   General appearance: Well appearing, and in no distress  Mental status: Alert, oriented to person, place, and time  Skin: Warm & dry  Cardiovascular: Normal heart rate noted  Respiratory: Normal respiratory effort, no distress  Abdomen: Soft, gravid, nontender  Pelvic: Cervical exam deferred         Extremities: Edema: None  Fetal Status: Fetal Heart Rate (bpm): 150 Fundal Height: 22 cm Movement:  Present    Chaperone: N/A   No results found for this or any previous visit (from the past 24 hour(s)).  Assessment & Plan:  1) Low-risk pregnancy G3P2002 at [redacted]w[redacted]d with an Estimated Date of Delivery: 10/13/20   2) Sciatica, continue stretches  3) Dizziness> hgb 13.4   Meds: No orders of the defined types were placed in this encounter.  Labs/procedures today: fingerstick hgb  Plan:  Continue routine obstetrical care  Next visit: prefers will be in person for pn2    Reviewed: Preterm labor symptoms and general obstetric precautions including but not limited to vaginal bleeding, contractions, leaking of fluid and fetal movement were reviewed in detail with the patient.  All questions were answered.  Follow-up: Return in about 4 weeks (around 07/10/2020) for LROB, PN2, CNM, in person.  Future Appointments  Date Time Provider Department Center  07/10/2020  9:00 AM [facility]  07/10/2020  9:30 AM Arabella Merles, CNM [facility]    No orders of the defined types were placed in this encounter.  Cheral Marker CNM, [facility] 06/12/2020 9:15 AM

## 2020-06-12 NOTE — Addendum Note (Signed)
Addended by: Moss Mc on: 06/12/2020 10:03 AM   Modules accepted: Orders

## 2020-07-10 ENCOUNTER — Encounter: Payer: Self-pay | Admitting: Women's Health

## 2020-07-10 ENCOUNTER — Other Ambulatory Visit: Payer: Medicaid Other

## 2020-07-10 ENCOUNTER — Other Ambulatory Visit: Payer: Self-pay

## 2020-07-10 ENCOUNTER — Ambulatory Visit (INDEPENDENT_AMBULATORY_CARE_PROVIDER_SITE_OTHER): Payer: Medicaid Other | Admitting: Women's Health

## 2020-07-10 VITALS — BP 117/80 | HR 91 | Wt 163.0 lb

## 2020-07-10 DIAGNOSIS — Z3483 Encounter for supervision of other normal pregnancy, third trimester: Secondary | ICD-10-CM

## 2020-07-10 DIAGNOSIS — Z3A26 26 weeks gestation of pregnancy: Secondary | ICD-10-CM

## 2020-07-10 DIAGNOSIS — Z3480 Encounter for supervision of other normal pregnancy, unspecified trimester: Secondary | ICD-10-CM

## 2020-07-10 DIAGNOSIS — Z3482 Encounter for supervision of other normal pregnancy, second trimester: Secondary | ICD-10-CM

## 2020-07-10 NOTE — Patient Instructions (Signed)
[name], thank you for choosing our office today! We appreciate the opportunity to meet your healthcare needs. You may receive a short survey by mail, e-mail, or through MyChart. If you are happy with your care we would appreciate if you could take just a few minutes to complete the survey questions. We read all of your comments and take your feedback very seriously. Thank you again for choosing our office.  [facility]  [facility] ([address] [place], [place] [address]) Entrance C, located off of [address] Free 24/7 valet parking   CLASSES: Go to [email or web address] to register for classes (childbirth, breastfeeding, waterbirth, infant CPR, daddy bootcamp, etc.)  Call the office ([phone]) or go to [facility] if: You begin to have strong, frequent contractions Your water breaks.  Sometimes it is a big gush of fluid, sometimes it is just a trickle that keeps getting your panties wet or running down your legs You have vaginal bleeding.  It is normal to have a small amount of spotting if your cervix was checked.  You don't feel your baby moving like normal.  If you don't, get you something to eat and drink and lay down and focus on feeling your baby move.   If your baby is still not moving like normal, you should call the office or go to [facility].  Call the office ([phone]) or go to [facility] for these signs of pre-eclampsia: Severe headache that does not go away with Tylenol Visual changes- seeing spots, double, blurred vision Pain under your right breast or upper abdomen that does not go away with Tums or heartburn medicine Nausea and/or vomiting Severe swelling in your hands, feet, and face   Tdap Vaccine It is recommended that you get the Tdap vaccine during the third trimester of EACH pregnancy to help protect your baby from getting pertussis (whooping cough) 27-36 weeks is the BEST time to do  this so that you can pass the protection on to your baby. During pregnancy is better than after pregnancy, but if you are unable to get it during pregnancy it will be offered at the hospital.  You can get this vaccine with us, at the health department, your family doctor, or some local pharmacies Everyone who will be around your baby should also be up-to-date on their vaccines before the baby comes. Adults (who are not pregnant) only need 1 dose of Tdap during adulthood.   [place] Pediatricians/Family Doctors [facility] ([facility]): [address], [phone]           [facility]: [address], [phone]                [facility] ([facility]): [address], [phone] (call to ask if accepting patients) [facility]: [address], [place], [phone]    [place] Pediatricians/Family Doctors [facility] ([facility]): [address], [phone] [facility]: [address], [phone] [facility]: [address], [phone]  [place] [facility]  [facility] ([facility]): [phone] [facility]: [address], [phone]   [place] [facility]: [address], [phone]  [place] [facility]  [facility]: [address], [phone]  Home Blood Pressure Monitoring for Patients   Your provider has recommended that you check your   blood pressure (BP) at least once a week at home. If you do not have a blood pressure cuff at home, one will be provided for you. Contact your provider if you have not received your monitor within 1 week.   Helpful Tips for Accurate Home Blood Pressure Checks  Don't smoke, exercise, or drink caffeine 30 minutes before checking your BP Use the restroom before checking your BP (a full bladder can raise your  pressure) Relax in a comfortable upright chair Feet on the ground Left arm resting comfortably on a flat surface at the level of your heart Legs uncrossed Back supported Sit quietly and don't talk Place the cuff on your bare arm Adjust snuggly, so that only two fingertips can fit between your skin and the top of the cuff Check 2 readings separated by at least one minute Keep a log of your BP readings For a visual, please reference this diagram: http://ccnc.care/bpdiagram  Provider Name: [facility]     Phone: [phone]  Zone 1: ALL CLEAR  Continue to monitor your symptoms:  BP reading is less than 140 (top number) or less than 90 (bottom number)  No right upper stomach pain No headaches or seeing spots No feeling nauseated or throwing up No swelling in face and hands  Zone 2: CAUTION Call your doctor's office for any of the following:  BP reading is greater than 140 (top number) or greater than 90 (bottom number)  Stomach pain under your ribs in the middle or right side Headaches or seeing spots Feeling nauseated or throwing up Swelling in face and hands  Zone 3: EMERGENCY  Seek immediate medical care if you have any of the following:  BP reading is greater than160 (top number) or greater than 110 (bottom number) Severe headaches not improving with Tylenol Serious difficulty catching your breath Any worsening symptoms from Zone 2   Third Trimester of Pregnancy The third trimester is from week 29 through week 42, months 7 through 9. The third trimester is a time when the fetus is growing rapidly. At the end of the ninth month, the fetus is about 20 inches in length and weighs 6-10 pounds.  BODY CHANGES Your body goes through many changes during pregnancy. The changes vary from woman to woman.  Your weight will continue to increase. You can expect to gain 25-35 pounds (11-16 kg) by the end of the pregnancy. You may begin to get stretch marks on your hips, abdomen,  and breasts. You may urinate more often because the fetus is moving lower into your pelvis and pressing on your bladder. You may develop or continue to have heartburn as a result of your pregnancy. You may develop constipation because certain hormones are causing the muscles that push waste through your intestines to slow down. You may develop hemorrhoids or swollen, bulging veins (varicose veins). You may have pelvic pain because of the weight gain and pregnancy hormones relaxing your joints between the bones in your pelvis. Backaches may result from overexertion of the muscles supporting your posture. You may have changes in your hair. These can include thickening of your hair, rapid growth, and changes in texture. Some women also have hair loss during or after pregnancy, or hair that feels dry or thin. Your hair will most likely return to normal after your baby is born. Your breasts will continue to grow and be tender. A yellow discharge may leak from your breasts called colostrum. Your belly button may stick out. You may   feel short of breath because of your expanding uterus. You may notice the fetus "dropping," or moving lower in your abdomen. You may have a bloody mucus discharge. This usually occurs a few days to a week before labor begins. Your cervix becomes thin and soft (effaced) near your due date. WHAT TO EXPECT AT YOUR PRENATAL EXAMS  You will have prenatal exams every 2 weeks until week 36. Then, you will have weekly prenatal exams. During a routine prenatal visit: You will be weighed to make sure you and the fetus are growing normally. Your blood pressure is taken. Your abdomen will be measured to track your baby's growth. The fetal heartbeat will be listened to. Any test results from the previous visit will be discussed. You may have a cervical check near your due date to see if you have effaced. At around 36 weeks, your caregiver will check your cervix. At the same time, your  caregiver will also perform a test on the secretions of the vaginal tissue. This test is to determine if a type of bacteria, Group B streptococcus, is present. Your caregiver will explain this further. Your caregiver may ask you: What your birth plan is. How you are feeling. If you are feeling the baby move. If you have had any abnormal symptoms, such as leaking fluid, bleeding, severe headaches, or abdominal cramping. If you have any questions. Other tests or screenings that may be performed during your third trimester include: Blood tests that check for low iron levels (anemia). Fetal testing to check the health, activity level, and growth of the fetus. Testing is done if you have certain medical conditions or if there are problems during the pregnancy. FALSE LABOR You may feel small, irregular contractions that eventually go away. These are called Braxton Hicks contractions, or false labor. Contractions may last for hours, days, or even weeks before true labor sets in. If contractions come at regular intervals, intensify, or become painful, it is best to be seen by your caregiver.  SIGNS OF LABOR  Menstrual-like cramps. Contractions that are 5 minutes apart or less. Contractions that start on the top of the uterus and spread down to the lower abdomen and back. A sense of increased pelvic pressure or back pain. A watery or bloody mucus discharge that comes from the vagina. If you have any of these signs before the 37th week of pregnancy, call your caregiver right away. You need to go to the hospital to get checked immediately. HOME CARE INSTRUCTIONS  Avoid all smoking, herbs, alcohol, and unprescribed drugs. These chemicals affect the formation and growth of the baby. Follow your caregiver's instructions regarding medicine use. There are medicines that are either safe or unsafe to take during pregnancy. Exercise only as directed by your caregiver. Experiencing uterine cramps is a good sign to  stop exercising. Continue to eat regular, healthy meals. Wear a good support bra for breast tenderness. Do not use hot tubs, steam rooms, or saunas. Wear your seat belt at all times when driving. Avoid raw meat, uncooked cheese, cat litter boxes, and soil used by cats. These carry germs that can cause birth defects in the baby. Take your prenatal vitamins. Try taking a stool softener (if your caregiver approves) if you develop constipation. Eat more high-fiber foods, such as fresh vegetables or fruit and whole grains. Drink plenty of fluids to keep your urine clear or pale yellow. Take warm sitz baths to soothe any pain or discomfort caused by hemorrhoids. Use hemorrhoid cream if   your caregiver approves. If you develop varicose veins, wear support hose. Elevate your feet for 15 minutes, 3-4 times a day. Limit salt in your diet. Avoid heavy lifting, wear low heal shoes, and practice good posture. Rest a lot with your legs elevated if you have leg cramps or low back pain. Visit your dentist if you have not gone during your pregnancy. Use a soft toothbrush to brush your teeth and be gentle when you floss. A sexual relationship may be continued unless your caregiver directs you otherwise. Do not travel far distances unless it is absolutely necessary and only with the approval of your caregiver. Take prenatal classes to understand, practice, and ask questions about the labor and delivery. Make a trial run to the hospital. Pack your hospital bag. Prepare the baby's nursery. Continue to go to all your prenatal visits as directed by your caregiver. SEEK MEDICAL CARE IF: You are unsure if you are in labor or if your water has broken. You have dizziness. You have mild pelvic cramps, pelvic pressure, or nagging pain in your abdominal area. You have persistent nausea, vomiting, or diarrhea. You have a bad smelling vaginal discharge. You have pain with urination. SEEK IMMEDIATE MEDICAL CARE IF:  You  have a fever. You are leaking fluid from your vagina. You have spotting or bleeding from your vagina. You have severe abdominal cramping or pain. You have rapid weight loss or gain. You have shortness of breath with chest pain. You notice sudden or extreme swelling of your face, hands, ankles, feet, or legs. You have not felt your baby move in over an hour. You have severe headaches that do not go away with medicine. You have vision changes. Document Released: 12/30/2000 Document Revised: 01/10/2013 Document Reviewed: 03/08/2012 ExitCare Patient Information 2015 [employer]. This information is not intended to replace advice given to you by your health care provider. Make sure you discuss any questions you have with your health care provider.       

## 2020-07-10 NOTE — Progress Notes (Signed)
    LOW-RISK PREGNANCY VISIT Patient name: [name] MRN [id number]  Date of birth: 1995/03/15 Chief Complaint:   Routine Prenatal Visit (PN2 today; having spells of dizziness)  History of Present Illness:   [name] is a 25 y.o. G75P2002 female at [redacted]w[redacted]d with an Estimated Date of Delivery: 10/13/20 being seen today for ongoing management of a low-risk pregnancy.   Today she reports dizziness usually after large meals.  Contractions: Irregular. Vag. Bleeding: None.  Movement: Present. denies leaking of fluid.  Depression screen [facility] 2/9 04/01/2020 02/05/2017 01/01/2016  Decreased Interest 1 0 0  Down, Depressed, Hopeless 2 0 0  PHQ - 2 Score 3 0 0  Altered sleeping 2 - -  Tired, decreased energy 3 - -  Change in appetite 3 - -  Feeling bad or failure about yourself  1 - -  Trouble concentrating 0 - -  Moving slowly or fidgety/restless 0 - -  Suicidal thoughts 0 - -  PHQ-9 Score 12 - -     GAD 7 : Generalized Anxiety Score 04/01/2020  Nervous, Anxious, on Edge 2  Control/stop worrying 0  Worry too much - different things 2  Trouble relaxing 2  Restless 0  Easily annoyed or irritable 3  Afraid - awful might happen 0  Total GAD 7 Score 9      Review of Systems:   Pertinent items are noted in HPI Denies abnormal vaginal discharge w/ itching/odor/irritation, headaches, visual changes, shortness of breath, chest pain, abdominal pain, severe nausea/vomiting, or problems with urination or bowel movements unless otherwise stated above. Pertinent History Reviewed:  Reviewed past medical,surgical, social, obstetrical and family history.  Reviewed problem list, medications and allergies. Physical Assessment:   Vitals:   07/10/20 0937  BP: 117/80  Pulse: 91  Weight: 163 lb (73.9 kg)  Body mass index is 26.31 kg/m.        Physical Examination:   General appearance: Well appearing, and in no distress  Mental status: Alert, oriented to person, place, and time  Skin: Warm  & dry  Cardiovascular: Normal heart rate noted  Respiratory: Normal respiratory effort, no distress  Abdomen: Soft, gravid, nontender  Pelvic: Cervical exam deferred         Extremities: Edema: Trace  Fetal Status: Fetal Heart Rate (bpm): 144 Fundal Height: 25 cm Movement: Present    Chaperone: N/A   No results found for this or any previous visit (from the past 24 hour(s)).  Assessment & Plan:  1) Low-risk pregnancy G3P2002 at [redacted]w[redacted]d with an Estimated Date of Delivery: 10/13/20   2) Dizziness, after large meals- eat smaller meals/snacks, gave printed prevention/relief measures   Meds: No orders of the defined types were placed in this encounter.  Labs/procedures today: PN2 and declined tdap today- wants next visit  Plan:  Continue routine obstetrical care  Next visit: prefers in person    Reviewed: Preterm labor symptoms and general obstetric precautions including but not limited to vaginal bleeding, contractions, leaking of fluid and fetal movement were reviewed in detail with the patient.  All questions were answered.   Follow-up: Return in about 4 weeks (around 08/07/2020) for LROB, CNM, in person (tdap & rhogam).  Future Appointments  Date Time Provider Department Center  08/07/2020  9:10 AM Cheral Marker, CNM [facility]    No orders of the defined types were placed in this encounter.  Cheral Marker CNM, [facility] 07/10/2020 9:54 AM

## 2020-07-11 LAB — CBC
Hematocrit: 37.9 % (ref 34.0–46.6)
Hemoglobin: 12.3 g/dL (ref 11.1–15.9)
MCH: 29.3 pg (ref 26.6–33.0)
MCHC: 32.5 g/dL (ref 31.5–35.7)
MCV: 90 fL (ref 79–97)
Platelets: 271 10*3/uL (ref 150–450)
RBC: 4.2 x10E6/uL (ref 3.77–5.28)
RDW: 12.3 % (ref 11.7–15.4)
WBC: 14.6 10*3/uL — ABNORMAL HIGH (ref 3.4–10.8)

## 2020-07-11 LAB — GLUCOSE TOLERANCE, 2 HOURS W/ 1HR
Glucose, 1 hour: 136 mg/dL (ref 65–179)
Glucose, 2 hour: 114 mg/dL (ref 65–152)
Glucose, Fasting: 71 mg/dL (ref 65–91)

## 2020-07-11 LAB — ANTIBODY SCREEN: Antibody Screen: NEGATIVE

## 2020-07-11 LAB — RPR: RPR Ser Ql: NONREACTIVE

## 2020-07-11 LAB — HIV ANTIBODY (ROUTINE TESTING W REFLEX): HIV Screen 4th Generation wRfx: NONREACTIVE

## 2020-08-07 ENCOUNTER — Other Ambulatory Visit (HOSPITAL_COMMUNITY)
Admission: RE | Admit: 2020-08-07 | Discharge: 2020-08-07 | Disposition: A | Discharge status: Home / Self Care | Payer: Medicaid Other | Source: Ambulatory Visit | Attending: Obstetrics & Gynecology | Admitting: Obstetrics & Gynecology

## 2020-08-07 ENCOUNTER — Other Ambulatory Visit: Payer: Self-pay

## 2020-08-07 ENCOUNTER — Ambulatory Visit (INDEPENDENT_AMBULATORY_CARE_PROVIDER_SITE_OTHER): Payer: Medicaid Other | Admitting: Women's Health

## 2020-08-07 ENCOUNTER — Encounter: Payer: Self-pay | Admitting: Women's Health

## 2020-08-07 VITALS — BP 124/68 | HR 84 | Wt 167.0 lb

## 2020-08-07 DIAGNOSIS — Z3483 Encounter for supervision of other normal pregnancy, third trimester: Secondary | ICD-10-CM

## 2020-08-07 DIAGNOSIS — Z3480 Encounter for supervision of other normal pregnancy, unspecified trimester: Secondary | ICD-10-CM | POA: Diagnosis not present

## 2020-08-07 DIAGNOSIS — Z3A30 30 weeks gestation of pregnancy: Secondary | ICD-10-CM | POA: Diagnosis not present

## 2020-08-07 DIAGNOSIS — N898 Other specified noninflammatory disorders of vagina: Secondary | ICD-10-CM | POA: Insufficient documentation

## 2020-08-07 DIAGNOSIS — Z6791 Unspecified blood type, Rh negative: Secondary | ICD-10-CM

## 2020-08-07 DIAGNOSIS — Z23 Encounter for immunization: Secondary | ICD-10-CM

## 2020-08-07 DIAGNOSIS — O26893 Other specified pregnancy related conditions, third trimester: Secondary | ICD-10-CM | POA: Diagnosis present

## 2020-08-07 NOTE — Progress Notes (Signed)
LOW-RISK PREGNANCY VISIT Patient name: [name] MRN [id number]  Date of birth: [birth year] Chief Complaint:   Routine Prenatal Visit (Tdap and Rhogam today; + pressure and contractions)  History of Present Illness:   [name] is a 25 y.o. G63P2002 female at [redacted]w[redacted]d with an Estimated Date of Delivery: 10/13/20 being seen today for ongoing management of a low-risk pregnancy.   Today she reports  cramping, pressure, clear discharge- worse since Sunday. No vb or lof. No itching/odor/irritation.  Going to the beach next week.  Contractions: Irregular. Vag. Bleeding: None.  Movement: Present. denies leaking of fluid.  Depression screen [facility] 2/9 07/10/2020 04/01/2020 02/05/2017 01/01/2016  Decreased Interest 0 1 0 0  Down, Depressed, Hopeless 1 2 0 0  PHQ - 2 Score 1 3 0 0  Altered sleeping 2 2 - -  Tired, decreased energy 2 3 - -  Change in appetite 0 3 - -  Feeling bad or failure about yourself  1 1 - -  Trouble concentrating 0 0 - -  Moving slowly or fidgety/restless 0 0 - -  Suicidal thoughts 0 0 - -  PHQ-9 Score 6 12 - -     GAD 7 : Generalized Anxiety Score 07/10/2020 04/01/2020  Nervous, Anxious, on Edge 2 2  Control/stop worrying 0 0  Worry too much - different things 2 2  Trouble relaxing 2 2  Restless 0 0  Easily annoyed or irritable 2 3  Afraid - awful might happen 0 0  Total GAD 7 Score 8 9      Review of Systems:   Pertinent items are noted in HPI Denies abnormal vaginal discharge w/ itching/odor/irritation, headaches, visual changes, shortness of breath, chest pain, abdominal pain, severe nausea/vomiting, or problems with urination or bowel movements unless otherwise stated above. Pertinent History Reviewed:  Reviewed past medical,surgical, social, obstetrical and family history.  Reviewed problem list, medications and allergies. Physical Assessment:   Vitals:   08/07/20 0914  BP: 124/68  Pulse: 84  Weight: 167 lb (75.8 kg)  Body mass index is 26.95  kg/m.        Physical Examination:   General appearance: Well appearing, and in no distress  Mental status: Alert, oriented to person, place, and time  Skin: Warm & dry  Cardiovascular: Normal heart rate noted  Respiratory: Normal respiratory effort, no distress  Abdomen: Soft, gravid, nontender  Pelvic:  spec exam: cx visually long/closed, small amt white d/c, no pooling, no change w/ valsalva. SVE: outer os 2, inner os closed/th/ballotable   Dilation: Closed Effacement (%): Thick Station: Ballotable  Extremities: Edema: Trace  Fetal Status: Fetal Heart Rate (bpm): 135 Fundal Height: 30 cm Movement: Present    Chaperone: Malachy Mood   No results found for this or any previous visit (from the past 24 hour(s)).  Assessment & Plan:  1) Low-risk pregnancy G3P2002 at [redacted]w[redacted]d with an Estimated Date of Delivery: 10/13/20   2) Cramping, pressure, vag d/c, CV swab sent, exam normal. Increase water intake, reviewed pre-e s/s, reasons to seek care  3) Going to beach next week> Discussed she is at a high risk for DVT/PE when pregnancy and traveling increases that risk. Recommended walking around q 1hr, don't cross legs, and dorsiflex feet often to help decrease r/f DVT/PE. Know where closest hospital is.    Meds: No orders of the defined types were placed in this encounter.  Labs/procedures today: spec exam, CV swab, SVE, tdap, and Rhogam  Plan:  Continue routine obstetrical care  Next visit: prefers in person    Reviewed: Preterm labor symptoms and general obstetric precautions including but not limited to vaginal bleeding, contractions, leaking of fluid and fetal movement were reviewed in detail with the patient.  All questions were answered.  Follow-up: Return in about 2 weeks (around 08/21/2020) for LROB, CNM, in person.  Future Appointments  Date Time Provider Department Center  08/22/2020  3:30 PM Cheral Marker, CNM [facility]    Orders Placed This Encounter  Procedures   Tdap  vaccine greater than or equal to 7yo IM   RHO (D) Immune Globulin   Cheral Marker CNM, [facility] 08/07/2020 10:11 AM

## 2020-08-07 NOTE — Patient Instructions (Signed)
[name], thank you for choosing our office today! We appreciate the opportunity to meet your healthcare needs. You may receive a short survey by mail, e-mail, or through [employer]. If you are happy with your care we would appreciate if you could take just a few minutes to complete the survey questions. We read all of your comments and take your feedback very seriously. Thank you again for choosing our office.  Center for [employer] Team at [facility]  [facility] at [facility] ([address] [place], [place] [address]) Entrance C, located off of [address] Free 24/7 valet parking   CLASSES: Go to [email or web address] to register for classes (childbirth, breastfeeding, waterbirth, infant CPR, daddy bootcamp, etc.)  Call the office [phone]) or go to [facility] if: You begin to have strong, frequent contractions Your water breaks.  Sometimes it is a big gush of fluid, sometimes it is just a trickle that keeps getting your panties wet or running down your legs You have vaginal bleeding.  It is normal to have a small amount of spotting if your cervix was checked.  You don't feel your baby moving like normal.  If you don't, get you something to eat and drink and lay down and focus on feeling your baby move.   If your baby is still not moving like normal, you should call the office or go to [facility].  Call the office [phone]) or go to [facility] hospital for these signs of pre-eclampsia: Severe headache that does not go away with Tylenol Visual changes- seeing spots, double, blurred vision Pain under your right breast or upper abdomen that does not go away with Tums or heartburn medicine Nausea and/or vomiting Severe swelling in your hands, feet, and face   Tdap Vaccine It is recommended that you get the Tdap vaccine during the third trimester of EACH pregnancy to help protect your baby from getting pertussis (whooping cough) 27-36 weeks is the BEST time to do  this so that you can pass the protection on to your baby. During pregnancy is better than after pregnancy, but if you are unable to get it during pregnancy it will be offered at the hospital.  You can get this vaccine with [place], at the health department, your family doctor, or some local pharmacies Everyone who will be around your baby should also be up-to-date on their vaccines before the baby comes. Adults (who are not pregnant) only need 1 dose of Tdap during adulthood.   [facility] Pediatricians/Family Doctors [facility]): [address] Colette Ribas, [phone]           [facility]: [address], [phone]                [facility] Medicine [facility]): [address], [phone] (call to ask if accepting patients) [facility] Department: [address], [place], [phone]    [facility] Pediatricians/Family Doctors [facility]): [phone] [address], [phone] [facility]: [address] [place], [phone] [facility]: [address], [phone]  [facility] Doctors  [facility]): [phone] [facility]: [address] [phone]   [facility] Doctors [facility]: [address], [phone]  [facility] Family Doctors  [facility]: [phone], [phone]  Home Blood Pressure Monitoring for Patients   Your provider has recommended that you check your  blood pressure (BP) at least once a week at home. If you do not have a blood pressure cuff at home, one will be provided for you. Contact your provider if you have not received your monitor within 1 week.   Helpful Tips for Accurate Home Blood Pressure Checks  Don't smoke, exercise, or drink caffeine 30 minutes before checking your BP Use the restroom before checking your BP (a full bladder can raise your  pressure) Relax in a comfortable upright chair Feet on the ground Left arm resting comfortably on a flat surface at the level of your heart Legs uncrossed Back supported Sit quietly and don't talk Place the cuff on your bare arm Adjust snuggly, so that only two fingertips can fit between your skin and the top of the cuff Check 2 readings separated by at least one minute Keep a log of your BP readings For a visual, please reference this diagram: http://ccnc.care/bpdiagram  Provider Name: [facility]     Phone: [phone]  Zone 1: ALL CLEAR  Continue to monitor your symptoms:  BP reading is less than 140 (top number) or less than 90 (bottom number)  No right upper stomach pain No headaches or seeing spots No feeling nauseated or throwing up No swelling in face and hands  Zone 2: CAUTION Call your doctor's office for any of the following:  BP reading is greater than 140 (top number) or greater than 90 (bottom number)  Stomach pain under your ribs in the middle or right side Headaches or seeing spots Feeling nauseated or throwing up Swelling in face and hands  Zone 3: EMERGENCY  Seek immediate medical care if you have any of the following:  BP reading is greater than160 (top number) or greater than 110 (bottom number) Severe headaches not improving with Tylenol Serious difficulty catching your breath Any worsening symptoms from Zone 2   Third Trimester of Pregnancy The third trimester is from week 29 through week 42, months 7 through 9. The third trimester is a time when the fetus is growing rapidly. At the end of the ninth month, the fetus is about 20 inches in length and weighs 6-10 pounds.  BODY CHANGES Your body goes through many changes during pregnancy. The changes vary from woman to woman.  Your weight will continue to increase. You can expect to gain 25-35 pounds (11-16 kg) by the end of the pregnancy. You may begin to get stretch marks on your hips, abdomen,  and breasts. You may urinate more often because the fetus is moving lower into your pelvis and pressing on your bladder. You may develop or continue to have heartburn as a result of your pregnancy. You may develop constipation because certain hormones are causing the muscles that push waste through your intestines to slow down. You may develop hemorrhoids or swollen, bulging veins (varicose veins). You may have pelvic pain because of the weight gain and pregnancy hormones relaxing your joints between the bones in your pelvis. Backaches may result from overexertion of the muscles supporting your posture. You may have changes in your hair. These can include thickening of your hair, rapid growth, and changes in texture. Some women also have hair loss during or after pregnancy, or hair that feels dry or thin. Your hair will most likely return to normal after your baby is born. Your breasts will continue to grow and be tender. A yellow discharge may leak from your breasts called colostrum. Your belly button may stick out. You may   feel short of breath because of your expanding uterus. You may notice the fetus "dropping," or moving lower in your abdomen. You may have a bloody mucus discharge. This usually occurs a few days to a week before labor begins. Your cervix becomes thin and soft (effaced) near your due date. WHAT TO EXPECT AT YOUR PRENATAL EXAMS  You will have prenatal exams every 2 weeks until week 36. Then, you will have weekly prenatal exams. During a routine prenatal visit: You will be weighed to make sure you and the fetus are growing normally. Your blood pressure is taken. Your abdomen will be measured to track your baby's growth. The fetal heartbeat will be listened to. Any test results from the previous visit will be discussed. You may have a cervical check near your due date to see if you have effaced. At around 36 weeks, your caregiver will check your cervix. At the same time, your  caregiver will also perform a test on the secretions of the vaginal tissue. This test is to determine if a type of bacteria, Group B streptococcus, is present. Your caregiver will explain this further. Your caregiver may ask you: What your birth plan is. How you are feeling. If you are feeling the baby move. If you have had any abnormal symptoms, such as leaking fluid, bleeding, severe headaches, or abdominal cramping. If you have any questions. Other tests or screenings that may be performed during your third trimester include: Blood tests that check for low iron levels (anemia). Fetal testing to check the health, activity level, and growth of the fetus. Testing is done if you have certain medical conditions or if there are problems during the pregnancy. FALSE LABOR You may feel small, irregular contractions that eventually go away. These are called Braxton Hicks contractions, or false labor. Contractions may last for hours, days, or even weeks before true labor sets in. If contractions come at regular intervals, intensify, or become painful, it is best to be seen by your caregiver.  SIGNS OF LABOR  Menstrual-like cramps. Contractions that are 5 minutes apart or less. Contractions that start on the top of the uterus and spread down to the lower abdomen and back. A sense of increased pelvic pressure or back pain. A watery or bloody mucus discharge that comes from the vagina. If you have any of these signs before the 37th week of pregnancy, call your caregiver right away. You need to go to the hospital to get checked immediately. HOME CARE INSTRUCTIONS  Avoid all smoking, herbs, alcohol, and unprescribed drugs. These chemicals affect the formation and growth of the baby. Follow your caregiver's instructions regarding medicine use. There are medicines that are either safe or unsafe to take during pregnancy. Exercise only as directed by your caregiver. Experiencing uterine cramps is a good sign to  stop exercising. Continue to eat regular, healthy meals. Wear a good support bra for breast tenderness. Do not use hot tubs, steam rooms, or saunas. Wear your seat belt at all times when driving. Avoid raw meat, uncooked cheese, cat litter boxes, and soil used by cats. These carry germs that can cause birth defects in the baby. Take your prenatal vitamins. Try taking a stool softener (if your caregiver approves) if you develop constipation. Eat more high-fiber foods, such as fresh vegetables or fruit and whole grains. Drink plenty of fluids to keep your urine clear or pale yellow. Take warm sitz baths to soothe any pain or discomfort caused by hemorrhoids. Use hemorrhoid cream if   your caregiver approves. If you develop varicose veins, wear support hose. Elevate your feet for 15 minutes, 3-4 times a day. Limit salt in your diet. Avoid heavy lifting, wear low heal shoes, and practice good posture. Rest a lot with your legs elevated if you have leg cramps or low back pain. Visit your dentist if you have not gone during your pregnancy. Use a soft toothbrush to brush your teeth and be gentle when you floss. A sexual relationship may be continued unless your caregiver directs you otherwise. Do not travel far distances unless it is absolutely necessary and only with the approval of your caregiver. Take prenatal classes to understand, practice, and ask questions about the labor and delivery. Make a trial run to the hospital. Pack your hospital bag. Prepare the baby's nursery. Continue to go to all your prenatal visits as directed by your caregiver. SEEK MEDICAL CARE IF: You are unsure if you are in labor or if your water has broken. You have dizziness. You have mild pelvic cramps, pelvic pressure, or nagging pain in your abdominal area. You have persistent nausea, vomiting, or diarrhea. You have a bad smelling vaginal discharge. You have pain with urination. SEEK IMMEDIATE MEDICAL CARE IF:  You  have a fever. You are leaking fluid from your vagina. You have spotting or bleeding from your vagina. You have severe abdominal cramping or pain. You have rapid weight loss or gain. You have shortness of breath with chest pain. You notice sudden or extreme swelling of your face, hands, ankles, feet, or legs. You have not felt your baby move in over an hour. You have severe headaches that do not go away with medicine. You have vision changes. Document Released: 12/30/2000 Document Revised: 01/10/2013 Document Reviewed: 03/08/2012 ExitCare Patient Information 2015 [employer]. This information is not intended to replace advice given to you by your health care provider. Make sure you discuss any questions you have with your health care provider.       

## 2020-08-07 NOTE — Progress Notes (Signed)
Clear vaginal discharge with no odor.

## 2020-08-08 LAB — CERVICOVAGINAL ANCILLARY ONLY
Bacterial Vaginitis (gardnerella): NEGATIVE
Candida Glabrata: NEGATIVE
Candida Vaginitis: NEGATIVE
Chlamydia: NEGATIVE
Comment: NEGATIVE
Comment: NEGATIVE
Comment: NEGATIVE
Comment: NEGATIVE
Comment: NEGATIVE
Comment: NORMAL
Neisseria Gonorrhea: NEGATIVE
Trichomonas: NEGATIVE

## 2020-08-22 ENCOUNTER — Encounter: Payer: Self-pay | Admitting: Women's Health

## 2020-08-22 ENCOUNTER — Ambulatory Visit (INDEPENDENT_AMBULATORY_CARE_PROVIDER_SITE_OTHER): Payer: Medicaid Other | Admitting: Women's Health

## 2020-08-22 ENCOUNTER — Other Ambulatory Visit: Payer: Self-pay

## 2020-08-22 VITALS — BP 112/72 | HR 122 | Wt 167.0 lb

## 2020-08-22 DIAGNOSIS — Z3483 Encounter for supervision of other normal pregnancy, third trimester: Secondary | ICD-10-CM

## 2020-08-22 DIAGNOSIS — Z3A32 32 weeks gestation of pregnancy: Secondary | ICD-10-CM

## 2020-08-22 NOTE — Patient Instructions (Signed)
[name], thank you for choosing our office today! We appreciate the opportunity to meet your healthcare needs. You may receive a short survey by mail, e-mail, or through [employer]. If you are happy with your care we would appreciate if you could take just a few minutes to complete the survey questions. We read all of your comments and take your feedback very seriously. Thank you again for choosing our office.  Center for [employer] Team at [facility]  [facility] at [facility] ([address] [place], [place] [address]) Entrance C, located off of [address] Free 24/7 valet parking   CLASSES: Go to [email or web address] to register for classes (childbirth, breastfeeding, waterbirth, infant CPR, daddy bootcamp, etc.)  Call the office [phone]) or go to [facility] if: You begin to have strong, frequent contractions Your water breaks.  Sometimes it is a big gush of fluid, sometimes it is just a trickle that keeps getting your panties wet or running down your legs You have vaginal bleeding.  It is normal to have a small amount of spotting if your cervix was checked.  You don't feel your baby moving like normal.  If you don't, get you something to eat and drink and lay down and focus on feeling your baby move.   If your baby is still not moving like normal, you should call the office or go to [facility].  Call the office [phone]) or go to [facility] hospital for these signs of pre-eclampsia: Severe headache that does not go away with Tylenol Visual changes- seeing spots, double, blurred vision Pain under your right breast or upper abdomen that does not go away with Tums or heartburn medicine Nausea and/or vomiting Severe swelling in your hands, feet, and face   Tdap Vaccine It is recommended that you get the Tdap vaccine during the third trimester of EACH pregnancy to help protect your baby from getting pertussis (whooping cough) 27-36 weeks is the BEST time to do  this so that you can pass the protection on to your baby. During pregnancy is better than after pregnancy, but if you are unable to get it during pregnancy it will be offered at the hospital.  You can get this vaccine with [place], at the health department, your family doctor, or some local pharmacies Everyone who will be around your baby should also be up-to-date on their vaccines before the baby comes. Adults (who are not pregnant) only need 1 dose of Tdap during adulthood.   [facility] Pediatricians/Family Doctors [facility]): [address] Colette Ribas, [phone]           [facility]: [address], [phone]                [facility]): [address], [phone] (call to ask if accepting patients) [facility] Department: [address], [place], [phone]    [facility] Pediatricians/Family Doctors [facility]): [phone] [address], [phone] [facility]: [address] [place], [phone] [facility]: [address], [phone]  [facility] Doctors  [facility]): [phone] [facility]: [address], [phone]   [facility] Doctors [facility]: [address] [phone]  [facility] Family Doctors  [facility]: [phone], [phone]  Home Blood Pressure Monitoring for Patients   Your provider has recommended that you check your  blood pressure (BP) at least once a week at home. If you do not have a blood pressure cuff at home, one will be provided for you. Contact your provider if you have not received your monitor within 1 week.   Helpful Tips for Accurate Home Blood Pressure Checks  Don't smoke, exercise, or drink caffeine 30 minutes before checking your BP Use the restroom before checking your BP (a full bladder can raise your  pressure) Relax in a comfortable upright chair Feet on the ground Left arm resting comfortably on a flat surface at the level of your heart Legs uncrossed Back supported Sit quietly and don't talk Place the cuff on your bare arm Adjust snuggly, so that only two fingertips can fit between your skin and the top of the cuff Check 2 readings separated by at least one minute Keep a log of your BP readings For a visual, please reference this diagram: http://ccnc.care/bpdiagram  Provider Name: [facility]     Phone: [phone]  Zone 1: ALL CLEAR  Continue to monitor your symptoms:  BP reading is less than 140 (top number) or less than 90 (bottom number)  No right upper stomach pain No headaches or seeing spots No feeling nauseated or throwing up No swelling in face and hands  Zone 2: CAUTION Call your doctor's office for any of the following:  BP reading is greater than 140 (top number) or greater than 90 (bottom number)  Stomach pain under your ribs in the middle or right side Headaches or seeing spots Feeling nauseated or throwing up Swelling in face and hands  Zone 3: EMERGENCY  Seek immediate medical care if you have any of the following:  BP reading is greater than160 (top number) or greater than 110 (bottom number) Severe headaches not improving with Tylenol Serious difficulty catching your breath Any worsening symptoms from Zone 2  Preterm Labor and Birth Information  The normal length of a pregnancy is 39-41 weeks. Preterm labor is when labor starts before 37 completed weeks of pregnancy. What are the risk factors for preterm labor? Preterm labor is more likely to occur in women who: Have certain infections during pregnancy such as a bladder infection, sexually transmitted infection, or infection inside the uterus (chorioamnionitis). Have a shorter-than-normal cervix. Have gone into preterm labor before. Have had surgery on their cervix. Are younger than age 17  or older than age 35. Are African American. Are pregnant with twins or multiple babies (multiple gestation). Take street drugs or smoke while pregnant. Do not gain enough weight while pregnant. Became pregnant shortly after having been pregnant. What are the symptoms of preterm labor? Symptoms of preterm labor include: Cramps similar to those that can happen during a menstrual period. The cramps may happen with diarrhea. Pain in the abdomen or lower back. Regular uterine contractions that may feel like tightening of the abdomen. A feeling of increased pressure in the pelvis. Increased watery or bloody mucus discharge from the vagina. Water breaking (ruptured amniotic sac). Why is it important to recognize signs of preterm labor? It is important to recognize signs of preterm labor because babies who are born prematurely may not be fully developed. This can put them at an increased risk for: Long-term (chronic) heart and lung problems. Difficulty immediately after birth with regulating body systems, including blood sugar, body temperature, heart rate, and breathing rate. Bleeding in the brain. Cerebral palsy. Learning difficulties. Death. These risks are highest for babies who are born before 34 weeks   of pregnancy. How is preterm labor treated? Treatment depends on the length of your pregnancy, your condition, and the health of your baby. It may involve: Having a stitch (suture) placed in your cervix to prevent your cervix from opening too early (cerclage). Taking or being given medicines, such as: Hormone medicines. These may be given early in pregnancy to help support the pregnancy. Medicine to stop contractions. Medicines to help mature the baby's lungs. These may be prescribed if the risk of delivery is high. Medicines to prevent your baby from developing cerebral palsy. If the labor happens before 34 weeks of pregnancy, you may need to stay in the hospital. What should I do if I  think I am in preterm labor? If you think that you are going into preterm labor, call your health care provider right away. How can I prevent preterm labor in future pregnancies? To increase your chance of having a full-term pregnancy: Do not use any tobacco products, such as cigarettes, chewing tobacco, and e-cigarettes. If you need help quitting, ask your health care provider. Do not use street drugs or medicines that have not been prescribed to you during your pregnancy. Talk with your health care provider before taking any herbal supplements, even if you have been taking them regularly. Make sure you gain a healthy amount of weight during your pregnancy. Watch for infection. If you think that you might have an infection, get it checked right away. Make sure to tell your health care provider if you have gone into preterm labor before. This information is not intended to replace advice given to you by your health care provider. Make sure you discuss any questions you have with your health care provider. Document Revised: 04/29/2018 Document Reviewed: 05/29/2015 Elsevier Patient Education  2020 [employer]   

## 2020-08-22 NOTE — Progress Notes (Signed)
LOW-RISK PREGNANCY VISIT Patient name: [name] MRN [id number]  Date of birth: Jun 04, 1995 Chief Complaint:   Routine Prenatal Visit  History of Present Illness:   [name] is a 25 y.o. G37P2002 female at [redacted]w[redacted]d with an Estimated Date of Delivery: 10/13/20 being seen today for ongoing management of a low-risk pregnancy.   Today she reports  continues to have clear discharge, has to wear pantyliner, some is thick, some is thinner/watery. No itching/odor/irritation. No big gush. Baby feels low. 4 contractions yesterday, not painful. . Contractions: Irregular. Vag. Bleeding: None.  Movement: Present. reports leaking of fluid.  Depression screen [facility] 2/9 07/10/2020 04/01/2020 02/05/2017 01/01/2016  Decreased Interest 0 1 0 0  Down, Depressed, Hopeless 1 2 0 0  PHQ - 2 Score 1 3 0 0  Altered sleeping 2 2 - -  Tired, decreased energy 2 3 - -  Change in appetite 0 3 - -  Feeling bad or failure about yourself  1 1 - -  Trouble concentrating 0 0 - -  Moving slowly or fidgety/restless 0 0 - -  Suicidal thoughts 0 0 - -  PHQ-9 Score 6 12 - -     GAD 7 : Generalized Anxiety Score 07/10/2020 04/01/2020  Nervous, Anxious, on Edge 2 2  Control/stop worrying 0 0  Worry too much - different things 2 2  Trouble relaxing 2 2  Restless 0 0  Easily annoyed or irritable 2 3  Afraid - awful might happen 0 0  Total GAD 7 Score 8 9      Review of Systems:   Pertinent items are noted in HPI Denies abnormal vaginal discharge w/ itching/odor/irritation, headaches, visual changes, shortness of breath, chest pain, abdominal pain, severe nausea/vomiting, or problems with urination or bowel movements unless otherwise stated above. Pertinent History Reviewed:  Reviewed past medical,surgical, social, obstetrical and family history.  Reviewed problem list, medications and allergies. Physical Assessment:   Vitals:   08/22/20 1546  BP: 112/72  Pulse: (!) 122  Weight: 167 lb (75.8 kg)  Body mass  index is 26.95 kg/m.        Physical Examination:   General appearance: Well appearing, and in no distress  Mental status: Alert, oriented to person, place, and time  Skin: Warm & dry  Cardiovascular: Normal heart rate noted  Respiratory: Normal respiratory effort, no distress  Abdomen: Soft, gravid, nontender  Pelvic:  SSE: cx visually long/closed, no pooling, no change w/ valsalva, normal appearing d/c, nitrazine neg          Extremities: Edema: None  Fetal Status: Fetal Heart Rate (bpm): 148 Fundal Height: 32 cm Movement: Present    Chaperone: Latisha Cresenzo   No results found for this or any previous visit (from the past 24 hour(s)).  Assessment & Plan:  1) Low-risk pregnancy G3P2002 at [redacted]w[redacted]d with an Estimated Date of Delivery: 10/13/20   2) Normal vaginal d/c, no evidence of ROM, if anything changes let us know   Meds: No orders of the defined types were placed in this encounter.  Labs/procedures today: spec exam  Plan:  Continue routine obstetrical care  Next visit: prefers in person    Reviewed: Preterm labor symptoms and general obstetric precautions including but not limited to vaginal bleeding, contractions, leaking of fluid and fetal movement were reviewed in detail with the patient.  All questions were answered. Does have home bp cuff. Office bp cuff given: not applicable. Check bp weekly, let us know  if consistently >140 and/or >90.  Follow-up: Return in about 2 weeks (around 09/05/2020) for LROB, CNM, in person.  Future Appointments  Date Time Provider Department Center  09/06/2020  9:10 AM Cheral Marker, CNM [facility]    No orders of the defined types were placed in this encounter.  Cheral Marker CNM, [facility] 08/22/2020 4:13 PM

## 2020-09-06 ENCOUNTER — Encounter: Payer: Self-pay | Admitting: Women's Health

## 2020-09-06 ENCOUNTER — Ambulatory Visit (INDEPENDENT_AMBULATORY_CARE_PROVIDER_SITE_OTHER): Payer: Medicaid Other | Admitting: Women's Health

## 2020-09-06 ENCOUNTER — Other Ambulatory Visit: Payer: Self-pay

## 2020-09-06 VITALS — BP 122/82 | HR 102 | Wt 171.0 lb

## 2020-09-06 DIAGNOSIS — Z3483 Encounter for supervision of other normal pregnancy, third trimester: Secondary | ICD-10-CM

## 2020-09-06 DIAGNOSIS — Z3A34 34 weeks gestation of pregnancy: Secondary | ICD-10-CM

## 2020-09-06 NOTE — Progress Notes (Signed)
LOW-RISK PREGNANCY VISIT Patient name: [name] MRN [id number]  Date of birth: July 27, 1995 Chief Complaint:   Routine Prenatal Visit  History of Present Illness:   [name] is a 25 y.o. G29P2002 female at [redacted]w[redacted]d with an Estimated Date of Delivery: 10/13/20 being seen today for ongoing management of a low-risk pregnancy.   Today she reports  baby has hiccups a lot, can see him moving much more than her other 2 children did. Some contractions that are uncomfortable, but not close together at all. . Contractions: Irregular. Vag. Bleeding: None.  Movement: Present. denies leaking of fluid.  Depression screen [facility] 2/9 07/10/2020 04/01/2020 02/05/2017 01/01/2016  Decreased Interest 0 1 0 0  Down, Depressed, Hopeless 1 2 0 0  PHQ - 2 Score 1 3 0 0  Altered sleeping 2 2 - -  Tired, decreased energy 2 3 - -  Change in appetite 0 3 - -  Feeling bad or failure about yourself  1 1 - -  Trouble concentrating 0 0 - -  Moving slowly or fidgety/restless 0 0 - -  Suicidal thoughts 0 0 - -  PHQ-9 Score 6 12 - -     GAD 7 : Generalized Anxiety Score 07/10/2020 04/01/2020  Nervous, Anxious, on Edge 2 2  Control/stop worrying 0 0  Worry too much - different things 2 2  Trouble relaxing 2 2  Restless 0 0  Easily annoyed or irritable 2 3  Afraid - awful might happen 0 0  Total GAD 7 Score 8 9      Review of Systems:   Pertinent items are noted in HPI Denies abnormal vaginal discharge w/ itching/odor/irritation, headaches, visual changes, shortness of breath, chest pain, abdominal pain, severe nausea/vomiting, or problems with urination or bowel movements unless otherwise stated above. Pertinent History Reviewed:  Reviewed past medical,surgical, social, obstetrical and family history.  Reviewed problem list, medications and allergies. Physical Assessment:   Vitals:   09/06/20 0924  BP: 122/82  Pulse: (!) 102  Weight: 171 lb (77.6 kg)  Body mass index is 27.6 kg/m.        Physical  Examination:   General appearance: Well appearing, and in no distress  Mental status: Alert, oriented to person, place, and time  Skin: Warm & dry  Cardiovascular: Normal heart rate noted  Respiratory: Normal respiratory effort, no distress  Abdomen: Soft, gravid, nontender  Pelvic: Cervical exam deferred         Extremities: Edema: None  Fetal Status: Fetal Heart Rate (bpm): 158 Fundal Height: 33 cm Movement: Present    Chaperone: N/A   No results found for this or any previous visit (from the past 24 hour(s)).  Assessment & Plan:  1) Low-risk pregnancy G3P2002 at [redacted]w[redacted]d with an Estimated Date of Delivery: 10/13/20    Meds: No orders of the defined types were placed in this encounter.  Labs/procedures today: none  Plan:  Continue routine obstetrical care  Next visit: prefers will be in person for cultures     Reviewed: Preterm labor symptoms and general obstetric precautions including but not limited to vaginal bleeding, contractions, leaking of fluid and fetal movement were reviewed in detail with the patient.  All questions were answered. Does have home bp cuff. Office bp cuff given: not applicable. Check bp weekly, let us know if consistently >140 and/or >90.  Follow-up: Return in about 2 weeks (around 09/20/2020) for LROB, CNM, in person.  No future appointments.  No orders  of the defined types were placed in this encounter.  Cheral Marker CNM, [facility] 09/06/2020 9:45 AM

## 2020-09-06 NOTE — Patient Instructions (Signed)
[name], thank you for choosing our office today! We appreciate the opportunity to meet your healthcare needs. You may receive a short survey by mail, e-mail, or through MyChart. If you are happy with your care we would appreciate if you could take just a few minutes to complete the survey questions. We read all of your comments and take your feedback very seriously. Thank you again for choosing our office.  [facility]  [facility] [place] ([address] [place], [place] [address]) Entrance C, located off of [address] Free 24/7 valet parking   CLASSES: Go to [email or web address] to register for classes (childbirth, breastfeeding, waterbirth, infant CPR, daddy bootcamp, etc.)  Call the office ([phone]) or go to [facility] if: You begin to have strong, frequent contractions Your water breaks.  Sometimes it is a big gush of fluid, sometimes it is just a trickle that keeps getting your panties wet or running down your legs You have vaginal bleeding.  It is normal to have a small amount of spotting if your cervix was checked.  You don't feel your baby moving like normal.  If you don't, get you something to eat and drink and lay down and focus on feeling your baby move.   If your baby is still not moving like normal, you should call the office or go to [facility].  Call the office ([phone]) or go to [facility] for these signs of pre-eclampsia: Severe headache that does not go away with Tylenol Visual changes- seeing spots, double, blurred vision Pain under your right breast or upper abdomen that does not go away with Tums or heartburn medicine Nausea and/or vomiting Severe swelling in your hands, feet, and face   Tdap Vaccine It is recommended that you get the Tdap vaccine during the third trimester of EACH pregnancy to help protect your baby from getting pertussis (whooping cough) 27-36 weeks is the BEST time to do  this so that you can pass the protection on to your baby. During pregnancy is better than after pregnancy, but if you are unable to get it during pregnancy it will be offered at the hospital.  You can get this vaccine with us, at the health department, your family doctor, or some local pharmacies Everyone who will be around your baby should also be up-to-date on their vaccines before the baby comes. Adults (who are not pregnant) only need 1 dose of Tdap during adulthood.   [place] Pediatricians/Family Doctors [facility] ([facility]): [address], [phone]           [facility]: [address], [phone]                [facility] ([facility]): [address], [phone] (call to ask if accepting patients) [facility]: [address], [place], [phone]    [place] Pediatricians/Family Doctors [facility] ([facility]): [address], [phone] [facility]: [address], [phone] [facility]: [address], [phone]  [place] [facility]  [facility] ([facility]): [phone] [facility]: [address], [phone]   [place] Family [facility]: [address], [phone]  [place] [facility]  [facility]: [address], [phone]  Home Blood Pressure Monitoring for Patients   Your provider has recommended that you check your   blood pressure (BP) at least once a week at home. If you do not have a blood pressure cuff at home, one will be provided for you. Contact your provider if you have not received your monitor within 1 week.   Helpful Tips for Accurate Home Blood Pressure Checks  Don't smoke, exercise, or drink caffeine 30 minutes before checking your BP Use the restroom before checking your BP (a full bladder can raise your  pressure) Relax in a comfortable upright chair Feet on the ground Left arm resting comfortably on a flat surface at the level of your heart Legs uncrossed Back supported Sit quietly and don't talk Place the cuff on your bare arm Adjust snuggly, so that only two fingertips can fit between your skin and the top of the cuff Check 2 readings separated by at least one minute Keep a log of your BP readings For a visual, please reference this diagram: http://ccnc.care/bpdiagram  Provider Name: [facility]     Phone: [phone]  Zone 1: ALL CLEAR  Continue to monitor your symptoms:  BP reading is less than 140 (top number) or less than 90 (bottom number)  No right upper stomach pain No headaches or seeing spots No feeling nauseated or throwing up No swelling in face and hands  Zone 2: CAUTION Call your doctor's office for any of the following:  BP reading is greater than 140 (top number) or greater than 90 (bottom number)  Stomach pain under your ribs in the middle or right side Headaches or seeing spots Feeling nauseated or throwing up Swelling in face and hands  Zone 3: EMERGENCY  Seek immediate medical care if you have any of the following:  BP reading is greater than160 (top number) or greater than 110 (bottom number) Severe headaches not improving with Tylenol Serious difficulty catching your breath Any worsening symptoms from Zone 2  Preterm Labor and Birth Information  The normal length of a pregnancy is 39-41 weeks. Preterm labor is when labor starts before 37 completed weeks of pregnancy. What are the risk factors for preterm labor? Preterm labor is more likely to occur in women who: Have certain infections during pregnancy such as a bladder infection, sexually transmitted infection, or infection inside the uterus (chorioamnionitis). Have a shorter-than-normal cervix. Have gone into preterm labor before. Have had surgery on their cervix. Are younger than age 17  or older than age 35. Are African American. Are pregnant with twins or multiple babies (multiple gestation). Take street drugs or smoke while pregnant. Do not gain enough weight while pregnant. Became pregnant shortly after having been pregnant. What are the symptoms of preterm labor? Symptoms of preterm labor include: Cramps similar to those that can happen during a menstrual period. The cramps may happen with diarrhea. Pain in the abdomen or lower back. Regular uterine contractions that may feel like tightening of the abdomen. A feeling of increased pressure in the pelvis. Increased watery or bloody mucus discharge from the vagina. Water breaking (ruptured amniotic sac). Why is it important to recognize signs of preterm labor? It is important to recognize signs of preterm labor because babies who are born prematurely may not be fully developed. This can put them at an increased risk for: Long-term (chronic) heart and lung problems. Difficulty immediately after birth with regulating body systems, including blood sugar, body temperature, heart rate, and breathing rate. Bleeding in the brain. Cerebral palsy. Learning difficulties. Death. These risks are highest for babies who are born before 34 weeks   of pregnancy. How is preterm labor treated? Treatment depends on the length of your pregnancy, your condition, and the health of your baby. It may involve: Having a stitch (suture) placed in your cervix to prevent your cervix from opening too early (cerclage). Taking or being given medicines, such as: Hormone medicines. These may be given early in pregnancy to help support the pregnancy. Medicine to stop contractions. Medicines to help mature the baby's lungs. These may be prescribed if the risk of delivery is high. Medicines to prevent your baby from developing cerebral palsy. If the labor happens before 34 weeks of pregnancy, you may need to stay in the hospital. What should I do if I  think I am in preterm labor? If you think that you are going into preterm labor, call your health care provider right away. How can I prevent preterm labor in future pregnancies? To increase your chance of having a full-term pregnancy: Do not use any tobacco products, such as cigarettes, chewing tobacco, and e-cigarettes. If you need help quitting, ask your health care provider. Do not use street drugs or medicines that have not been prescribed to you during your pregnancy. Talk with your health care provider before taking any herbal supplements, even if you have been taking them regularly. Make sure you gain a healthy amount of weight during your pregnancy. Watch for infection. If you think that you might have an infection, get it checked right away. Make sure to tell your health care provider if you have gone into preterm labor before. This information is not intended to replace advice given to you by your health care provider. Make sure you discuss any questions you have with your health care provider. Document Revised: 04/29/2018 Document Reviewed: 05/29/2015 Elsevier Patient Education  2020 [employer]   

## 2020-09-20 ENCOUNTER — Other Ambulatory Visit: Payer: Self-pay

## 2020-09-20 ENCOUNTER — Encounter: Payer: Self-pay | Admitting: Women's Health

## 2020-09-20 ENCOUNTER — Other Ambulatory Visit (HOSPITAL_COMMUNITY)
Admission: RE | Admit: 2020-09-20 | Discharge: 2020-09-20 | Disposition: A | Discharge status: Home / Self Care | Payer: Medicaid Other | Source: Ambulatory Visit | Attending: Women's Health | Admitting: Women's Health

## 2020-09-20 ENCOUNTER — Ambulatory Visit (INDEPENDENT_AMBULATORY_CARE_PROVIDER_SITE_OTHER): Payer: Medicaid Other | Admitting: Women's Health

## 2020-09-20 VITALS — BP 117/74 | HR 100 | Wt 172.0 lb

## 2020-09-20 DIAGNOSIS — Z3480 Encounter for supervision of other normal pregnancy, unspecified trimester: Secondary | ICD-10-CM | POA: Diagnosis present

## 2020-09-20 DIAGNOSIS — Z3483 Encounter for supervision of other normal pregnancy, third trimester: Secondary | ICD-10-CM

## 2020-09-20 DIAGNOSIS — Z3A36 36 weeks gestation of pregnancy: Secondary | ICD-10-CM | POA: Diagnosis present

## 2020-09-20 NOTE — Patient Instructions (Signed)
Amaris, thank you for choosing our office today! We appreciate the opportunity to meet your healthcare needs. You may receive a short survey by mail, e-mail, or through MyChart. If you are happy with your care we would appreciate if you could take just a few minutes to complete the survey questions. We read all of your comments and take your feedback very seriously. Thank you again for choosing our office.  [facility] [place] ([address] [place], [place] [address]) Entrance C, located off of [address] Free 24/7 valet parking   CLASSES: Go to [email or web address] to register for classes (childbirth, breastfeeding, waterbirth, infant CPR, daddy bootcamp, etc.)  Call the office ([phone]) or go to [facility] if: You begin to have strong, frequent contractions Your water breaks.  Sometimes it is a big gush of fluid, sometimes it is just a trickle that keeps getting your panties wet or running down your legs You have vaginal bleeding.  It is normal to have a small amount of spotting if your cervix was checked.  You don't feel your baby moving like normal.  If you don't, get you something to eat and drink and lay down and focus on feeling your baby move.   If your baby is still not moving like normal, you should call the office or go to [facility].  Call the office ([phone]) or go to [facility] for these signs of pre-eclampsia: Severe headache that does not go away with Tylenol Visual changes- seeing spots, double, blurred vision Pain under your right breast or upper abdomen that does not go away with Tums or heartburn medicine Nausea and/or vomiting Severe swelling in your hands, feet, and face   [place] Pediatricians/Family Doctors [facility] ([facility]): [address], [phone]           [facility]: [address], [phone]                  [facility] ([facility]): [address], [phone] (call to ask if accepting patients) [facility]: [address], [place], [phone]    [place] Pediatricians/Family Doctors [facility] ([facility]): [address], [phone] [facility]: [address], [phone] [facility] [place]: [address], [phone]  [place] [facility]  [facility] ([facility]): [phone] [facility]: [address], [phone]   [place] Family Doctors [facility]: [address], [phone]  [place] [facility]  [facility]: [address], [phone]  Home Blood Pressure Monitoring for Patients   Your provider has recommended that you check your blood pressure (BP) at least once a week at home. If you do not have a blood pressure cuff at home, one will be provided for you. Contact your provider if you have not received your monitor within 1 week.   Helpful Tips for Accurate Home Blood Pressure Checks  Don't smoke, exercise, or drink caffeine 30 minutes before checking your BP Use the restroom before checking your BP (a full bladder can raise your pressure) Relax in a comfortable upright chair Feet on the ground Left arm resting comfortably on a flat surface at the level of your heart Legs uncrossed Back supported Sit quietly and don't talk Place the cuff on your bare arm Adjust snuggly, so that only two fingertips   can fit between your skin and the top of the cuff Check 2 readings separated by at least one minute Keep a log of your BP readings For a visual, please reference this diagram: http://ccnc.care/bpdiagram  Provider Name: [facility]     Phone: [phone]  Zone 1: ALL CLEAR  Continue to monitor your symptoms:  BP reading is less than 140 (top number) or less than 90 (bottom number)  No right  upper stomach pain No headaches or seeing spots No feeling nauseated or throwing up No swelling in face and hands  Zone 2: CAUTION Call your doctor's office for any of the following:  BP reading is greater than 140 (top number) or greater than 90 (bottom number)  Stomach pain under your ribs in the middle or right side Headaches or seeing spots Feeling nauseated or throwing up Swelling in face and hands  Zone 3: EMERGENCY  Seek immediate medical care if you have any of the following:  BP reading is greater than160 (top number) or greater than 110 (bottom number) Severe headaches not improving with Tylenol Serious difficulty catching your breath Any worsening symptoms from Zone 2   Braxton Hicks Contractions Contractions of the uterus can occur throughout pregnancy, but they are not always a sign that you are in labor. You may have practice contractions called Braxton Hicks contractions. These false labor contractions are sometimes confused with true labor. What are [place] contractions? Braxton Hicks contractions are tightening movements that occur in the muscles of the uterus before labor. Unlike true labor contractions, these contractions do not result in opening (dilation) and thinning of the cervix. Toward the end of pregnancy (32-34 weeks), Braxton Hicks contractions can happen more often and may become stronger. These contractions are sometimes difficult to tell apart from true labor because they can be very uncomfortable. You should not feel embarrassed if you go to the hospital with false labor. Sometimes, the only way to tell if you are in true labor is for your health care provider to look for changes in the cervix. The health care provider will do a physical exam and may monitor your contractions. If you are not in true labor, the exam should show that your cervix is not dilating and your water has not broken. If there are no other health problems associated with your  pregnancy, it is completely safe for you to be sent home with false labor. You may continue to have Braxton Hicks contractions until you go into true labor. How to tell the difference between true labor and false labor True labor Contractions last 30-70 seconds. Contractions become very regular. Discomfort is usually felt in the top of the uterus, and it spreads to the lower abdomen and low back. Contractions do not go away with walking. Contractions usually become more intense and increase in frequency. The cervix dilates and gets thinner. False labor Contractions are usually shorter and not as strong as true labor contractions. Contractions are usually irregular. Contractions are often felt in the front of the lower abdomen and in the groin. Contractions may go away when you walk around or change positions while lying down. Contractions get weaker and are shorter-lasting as time goes on. The cervix usually does not dilate or become thin. Follow these instructions at home:  Take over-the-counter and prescription medicines only as told by your health care provider. Keep up with your usual exercises and follow other instructions from your health care provider. Eat and drink lightly if you think  you are going into labor. If Braxton Hicks contractions are making you uncomfortable: Change your position from lying down or resting to walking, or change from walking to resting. Sit and rest in a tub of warm water. Drink enough fluid to keep your urine pale yellow. Dehydration may cause these contractions. Do slow and deep breathing several times an hour. Keep all follow-up prenatal visits as told by your health care provider. This is important. Contact a health care provider if: You have a fever. You have continuous pain in your abdomen. Get help right away if: Your contractions become stronger, more regular, and closer together. You have fluid leaking or gushing from your vagina. You pass  blood-tinged mucus (bloody show). You have bleeding from your vagina. You have low back pain that you never had before. You feel your baby's head pushing down and causing pelvic pressure. Your baby is not moving inside you as much as it used to. Summary Contractions that occur before labor are called Braxton Hicks contractions, false labor, or practice contractions. Braxton Hicks contractions are usually shorter, weaker, farther apart, and less regular than true labor contractions. True labor contractions usually become progressively stronger and regular, and they become more frequent. Manage discomfort from [facility] contractions by changing position, resting in a warm bath, drinking plenty of water, or practicing deep breathing. This information is not intended to replace advice given to you by your health care provider. Make sure you discuss any questions you have with your health care provider. Document Revised: 12/18/2016 Document Reviewed: 05/21/2016 Elsevier Patient Education  [place].

## 2020-09-20 NOTE — Progress Notes (Signed)
    LOW-RISK PREGNANCY VISIT Patient name: [name] MRN [id number]  Date of birth: 1995-10-01 Chief Complaint:   Routine Prenatal Visit  History of Present Illness:   [name] is a 25 y.o. G3P2002 female at [redacted]w[redacted]d with an Estimated Date of Delivery: 10/13/20 being seen today for ongoing management of a low-risk pregnancy.   Today she reports no complaints. Contractions: Irregular. Vag. Bleeding: None.  Movement: Present. denies leaking of fluid.  Depression screen [facility] 2/9 07/10/2020 04/01/2020 02/05/2017 01/01/2016  Decreased Interest 0 1 0 0  Down, Depressed, Hopeless 1 2 0 0  PHQ - 2 Score 1 3 0 0  Altered sleeping 2 2 - -  Tired, decreased energy 2 3 - -  Change in appetite 0 3 - -  Feeling bad or failure about yourself  1 1 - -  Trouble concentrating 0 0 - -  Moving slowly or fidgety/restless 0 0 - -  Suicidal thoughts 0 0 - -  PHQ-9 Score 6 12 - -     GAD 7 : Generalized Anxiety Score 07/10/2020 04/01/2020  Nervous, Anxious, on Edge 2 2  Control/stop worrying 0 0  Worry too much - different things 2 2  Trouble relaxing 2 2  Restless 0 0  Easily annoyed or irritable 2 3  Afraid - awful might happen 0 0  Total GAD 7 Score 8 9      Review of Systems:   Pertinent items are noted in HPI Denies abnormal vaginal discharge w/ itching/odor/irritation, headaches, visual changes, shortness of breath, chest pain, abdominal pain, severe nausea/vomiting, or problems with urination or bowel movements unless otherwise stated above. Pertinent History Reviewed:  Reviewed past medical,surgical, social, obstetrical and family history.  Reviewed problem list, medications and allergies. Physical Assessment:   Vitals:   09/20/20 0920  BP: 117/74  Pulse: 100  Weight: 172 lb (78 kg)  Body mass index is 27.76 kg/m.        Physical Examination:   General appearance: Well appearing, and in no distress  Mental status: Alert, oriented to person, place, and time  Skin: Warm &  dry  Cardiovascular: Normal heart rate noted  Respiratory: Normal respiratory effort, no distress  Abdomen: Soft, gravid, nontender  Pelvic: Cervical exam performed  Dilation: 1 Effacement (%): 50 Station: -3  Extremities: Edema: None  Fetal Status: Fetal Heart Rate (bpm): 157 Fundal Height: 35 cm Movement: Present Presentation: Vertex  Chaperone: Latisha Cresenzo   No results found for this or any previous visit (from the past 24 hour(s)).  Assessment & Plan:  1) Low-risk pregnancy G3P2002 at [redacted]w[redacted]d with an Estimated Date of Delivery: 10/13/20    Meds: No orders of the defined types were placed in this encounter.  Labs/procedures today: GBS, GC/CT, and SVE, declined flu shot today-getting w/ kids  Plan:  Continue routine obstetrical care  Next visit: prefers in person    Reviewed: Preterm labor symptoms and general obstetric precautions including but not limited to vaginal bleeding, contractions, leaking of fluid and fetal movement were reviewed in detail with the patient.  All questions were answered. Does have home bp cuff. Office bp cuff given: not applicable. Check bp weekly, let us know if consistently >140 and/or >90.  Follow-up: Return in about 1 week (around 09/27/2020) for LROB, CNM, in person.  No future appointments.  Orders Placed This Encounter  Procedures   Strep Gp B NAA+Rflx   Cheral Marker CNM, [facility] 09/20/2020 9:44 AM

## 2020-09-22 LAB — STREP GP B NAA+RFLX: Strep Gp B NAA+Rflx: NEGATIVE

## 2020-09-25 LAB — CERVICOVAGINAL ANCILLARY ONLY
Chlamydia: NEGATIVE
Comment: NEGATIVE
Comment: NORMAL
Neisseria Gonorrhea: NEGATIVE

## 2020-09-30 ENCOUNTER — Other Ambulatory Visit: Payer: Self-pay

## 2020-09-30 ENCOUNTER — Ambulatory Visit (INDEPENDENT_AMBULATORY_CARE_PROVIDER_SITE_OTHER): Payer: Medicaid Other | Admitting: Obstetrics & Gynecology

## 2020-09-30 ENCOUNTER — Encounter: Payer: Self-pay | Admitting: Obstetrics & Gynecology

## 2020-09-30 VITALS — BP 119/84 | HR 108 | Wt 172.6 lb

## 2020-09-30 DIAGNOSIS — Z3483 Encounter for supervision of other normal pregnancy, third trimester: Secondary | ICD-10-CM

## 2020-09-30 DIAGNOSIS — Z3A38 38 weeks gestation of pregnancy: Secondary | ICD-10-CM

## 2020-09-30 NOTE — Progress Notes (Signed)
   LOW-RISK PREGNANCY VISIT Patient name: [name] MRN [id number]  Date of birth: May 07, 1995 Chief Complaint:   Routine Prenatal Visit  History of Present Illness:   [name] is a 25 y.o. G57P2002 female at [redacted]w[redacted]d with an Estimated Date of Delivery: 10/13/20 being seen today for ongoing management of a low-risk pregnancy.  Depression screen [facility] 2/9 07/10/2020 04/01/2020 02/05/2017 01/01/2016  Decreased Interest 0 1 0 0  Down, Depressed, Hopeless 1 2 0 0  PHQ - 2 Score 1 3 0 0  Altered sleeping 2 2 - -  Tired, decreased energy 2 3 - -  Change in appetite 0 3 - -  Feeling bad or failure about yourself  1 1 - -  Trouble concentrating 0 0 - -  Moving slowly or fidgety/restless 0 0 - -  Suicidal thoughts 0 0 - -  PHQ-9 Score 6 12 - -    Today she reports no complaints. Contractions: Irregular. Vag. Bleeding: None.  Movement: Present. denies leaking of fluid. Review of Systems:   Pertinent items are noted in HPI Denies abnormal vaginal discharge w/ itching/odor/irritation- same white discharge, headaches, visual changes, shortness of breath, chest pain, abdominal pain, severe nausea/vomiting, or problems with urination or bowel movements unless otherwise stated above. Pertinent History Reviewed:  Reviewed past medical,surgical, social, obstetrical and family history.  Reviewed problem list, medications and allergies.  Physical Assessment:   Vitals:   09/30/20 0857  BP: 119/84  Pulse: (!) 108  Weight: 172 lb 9.6 oz (78.3 kg)  Body mass index is 27.86 kg/m.        Physical Examination:   General appearance: Well appearing, and in no distress  Mental status: Alert, oriented to person, place, and time  Skin: Warm & dry  Respiratory: Normal respiratory effort, no distress  Abdomen: Soft, gravid, nontender  Pelvic: Cervical exam performed  Dilation: 1.5 Effacement (%): 50 Station: -2  Extremities: Edema: None  Psych:  mood and affect appropriate  Fetal Status: Fetal Heart  Rate (bpm): 150 Fundal Height: 37 cm Movement: Present Presentation: Vertex  Chaperone: n/a    No results found for this or any previous visit (from the past 24 hour(s)).   Assessment & Plan:  1) Low-risk pregnancy G3P2002 at [redacted]w[redacted]d with an Estimated Date of Delivery: 10/13/20    Meds: No orders of the defined types were placed in this encounter.  Labs/procedures today: doppler  Plan:  Continue routine obstetrical care Next visit: prefers in person    Reviewed: Term labor symptoms and general obstetric precautions including but not limited to vaginal bleeding, contractions, leaking of fluid and fetal movement were reviewed in detail with the patient.  All questions were answered.   Follow-up: Return in about 1 week (around 10/07/2020) for LROB visit.  No orders of the defined types were placed in this encounter.   Myna Hidalgo, DO Attending Obstetrician & Gynecologist, [facility] for [employer], [facility]

## 2020-10-08 ENCOUNTER — Encounter: Payer: Self-pay | Admitting: Women's Health

## 2020-10-08 ENCOUNTER — Inpatient Hospital Stay (HOSPITAL_COMMUNITY)
Admission: AD | Admit: 2020-10-08 | Discharge: 2020-10-10 | DRG: 807 | Disposition: A | Discharge status: Home / Self Care | Payer: Medicaid Other | Attending: Obstetrics and Gynecology | Admitting: Obstetrics and Gynecology

## 2020-10-08 ENCOUNTER — Encounter (HOSPITAL_COMMUNITY): Payer: Self-pay | Admitting: Obstetrics and Gynecology

## 2020-10-08 ENCOUNTER — Ambulatory Visit (INDEPENDENT_AMBULATORY_CARE_PROVIDER_SITE_OTHER): Payer: Medicaid Other | Admitting: Women's Health

## 2020-10-08 ENCOUNTER — Other Ambulatory Visit: Payer: Self-pay

## 2020-10-08 VITALS — BP 119/76 | HR 92 | Wt 176.0 lb

## 2020-10-08 DIAGNOSIS — J4520 Mild intermittent asthma, uncomplicated: Secondary | ICD-10-CM | POA: Diagnosis present

## 2020-10-08 DIAGNOSIS — O26899 Other specified pregnancy related conditions, unspecified trimester: Secondary | ICD-10-CM

## 2020-10-08 DIAGNOSIS — O9952 Diseases of the respiratory system complicating childbirth: Secondary | ICD-10-CM | POA: Diagnosis present

## 2020-10-08 DIAGNOSIS — Z3483 Encounter for supervision of other normal pregnancy, third trimester: Secondary | ICD-10-CM

## 2020-10-08 DIAGNOSIS — F411 Generalized anxiety disorder: Secondary | ICD-10-CM | POA: Diagnosis present

## 2020-10-08 DIAGNOSIS — Z23 Encounter for immunization: Secondary | ICD-10-CM

## 2020-10-08 DIAGNOSIS — Z880 Allergy status to penicillin: Secondary | ICD-10-CM

## 2020-10-08 DIAGNOSIS — Z3A39 39 weeks gestation of pregnancy: Secondary | ICD-10-CM

## 2020-10-08 DIAGNOSIS — Z3480 Encounter for supervision of other normal pregnancy, unspecified trimester: Secondary | ICD-10-CM

## 2020-10-08 DIAGNOSIS — O99344 Other mental disorders complicating childbirth: Secondary | ICD-10-CM | POA: Diagnosis present

## 2020-10-08 DIAGNOSIS — Z20822 Contact with and (suspected) exposure to covid-19: Secondary | ICD-10-CM | POA: Diagnosis present

## 2020-10-08 DIAGNOSIS — Z6791 Unspecified blood type, Rh negative: Secondary | ICD-10-CM

## 2020-10-08 DIAGNOSIS — O285 Abnormal chromosomal and genetic finding on antenatal screening of mother: Secondary | ICD-10-CM | POA: Diagnosis present

## 2020-10-08 DIAGNOSIS — O26893 Other specified pregnancy related conditions, third trimester: Secondary | ICD-10-CM | POA: Diagnosis present

## 2020-10-08 NOTE — MAU Note (Signed)
PT ARRIVED WITH URGE TO PUSH- BROUGHT FROM REGISTRATION TO RM 122.

## 2020-10-08 NOTE — Progress Notes (Signed)
    LOW-RISK PREGNANCY VISIT Patient name: [name] MRN [id number]  Date of birth: [birth year] Chief Complaint:   Routine Prenatal Visit (Cervical check)  History of Present Illness:   [name] is a 25 y.o. G47P2002 female at [redacted]w[redacted]d with an Estimated Date of Delivery: 10/13/20 being seen today for ongoing management of a low-risk pregnancy.   Today she reports occasional contractions. Contractions: Irregular.  .  Movement: Present. denies leaking of fluid.  Depression screen [facility] 2/9 07/10/2020 04/01/2020 02/05/2017 01/01/2016  Decreased Interest 0 1 0 0  Down, Depressed, Hopeless 1 2 0 0  PHQ - 2 Score 1 3 0 0  Altered sleeping 2 2 - -  Tired, decreased energy 2 3 - -  Change in appetite 0 3 - -  Feeling bad or failure about yourself  1 1 - -  Trouble concentrating 0 0 - -  Moving slowly or fidgety/restless 0 0 - -  Suicidal thoughts 0 0 - -  PHQ-9 Score 6 12 - -     GAD 7 : Generalized Anxiety Score 07/10/2020 04/01/2020  Nervous, Anxious, on Edge 2 2  Control/stop worrying 0 0  Worry too much - different things 2 2  Trouble relaxing 2 2  Restless 0 0  Easily annoyed or irritable 2 3  Afraid - awful might happen 0 0  Total GAD 7 Score 8 9      Review of Systems:   Pertinent items are noted in HPI Denies abnormal vaginal discharge w/ itching/odor/irritation, headaches, visual changes, shortness of breath, chest pain, abdominal pain, severe nausea/vomiting, or problems with urination or bowel movements unless otherwise stated above. Pertinent History Reviewed:  Reviewed past medical,surgical, social, obstetrical and family history.  Reviewed problem list, medications and allergies. Physical Assessment:   Vitals:   10/08/20 1429  BP: 119/76  Pulse: 92  Weight: 176 lb (79.8 kg)  Body mass index is 28.41 kg/m.        Physical Examination:   General appearance: Well appearing, and in no distress  Mental status: Alert, oriented to person, place, and time  Skin:  Warm & dry  Cardiovascular: Normal heart rate noted  Respiratory: Normal respiratory effort, no distress  Abdomen: Soft, gravid, nontender  Pelvic: Cervical exam performed  Dilation: 2.5 Effacement (%): 50 Station: -2, Offered membrane sweeping, discussed r/b- pt decided to proceed, so membranes swept.   Extremities: Edema: Trace  Fetal Status: Fetal Heart Rate (bpm): 135 Fundal Height: 38 cm Movement: Present Presentation: Vertex  Chaperone: Peggy Dones   No results found for this or any previous visit (from the past 24 hour(s)).  Assessment & Plan:  1) Low-risk pregnancy G3P2002 at [redacted]w[redacted]d with an Estimated Date of Delivery: 10/13/20    Meds: No orders of the defined types were placed in this encounter.  Labs/procedures today: SVE, membrane sweep, and flu shot  Plan:  Continue routine obstetrical care  Next visit: prefers will be in person for nst     Reviewed: Term labor symptoms and general obstetric precautions including but not limited to vaginal bleeding, contractions, leaking of fluid and fetal movement were reviewed in detail with the patient.  All questions were answered.   Follow-up: Return in about 1 week (around 10/15/2020) for LROB, NST, CNM, in person.  No future appointments.  No orders of the defined types were placed in this encounter.  Cheral Marker CNM, [facility] 10/08/2020 2:50 PM

## 2020-10-08 NOTE — Patient Instructions (Signed)
[name], thank you for choosing our office today! We appreciate the opportunity to meet your healthcare needs. You may receive a short survey by mail, e-mail, or through [employer]. If you are happy with your care we would appreciate if you could take just a few minutes to complete the survey questions. We read all of your comments and take your feedback very seriously. Thank you again for choosing our office.  Center for [employer] Team at [facility]  [facility] at [facility] ([address] [place], [place] [address]) Entrance C, located off of [address] Free 24/7 valet parking   CLASSES: Go to [email or web address] to register for classes (childbirth, breastfeeding, waterbirth, infant CPR, daddy bootcamp, etc.)  Call the office [phone]) or go to [facility] if: You begin to have strong, frequent contractions Your water breaks.  Sometimes it is a big gush of fluid, sometimes it is just a trickle that keeps getting your panties wet or running down your legs You have vaginal bleeding.  It is normal to have a small amount of spotting if your cervix was checked.  You don't feel your baby moving like normal.  If you don't, get you something to eat and drink and lay down and focus on feeling your baby move.   If your baby is still not moving like normal, you should call the office or go to [facility].  Call the office [phone]) or go to [facility] hospital for these signs of pre-eclampsia: Severe headache that does not go away with Tylenol Visual changes- seeing spots, double, blurred vision Pain under your right breast or upper abdomen that does not go away with Tums or heartburn medicine Nausea and/or vomiting Severe swelling in your hands, feet, and face   [facility] Pediatricians/Family Doctors [facility]): [address] Colette Ribas, [phone]           [facility]: [address], [phone]                 [facility] Family Medicine [facility]): [address], [phone] (call to ask if accepting patients) [facility] Department: [address], [place], [phone]    [facility] Pediatricians/Family Doctors [facility]): [address], [phone] [facility]: [address] [place], [phone] [facility]: [address], [phone]  [facility] Doctors  [facility]): [phone] [facility]: [address], [phone]   [facility] Doctors [facility]: [address], [phone]  [facility] Doctors  [facility]: [phone], [phone]  Home Blood Pressure Monitoring for Patients   Your provider has recommended that you check your blood pressure (BP) at least once a week at home. If you do not have a blood pressure cuff at home, one will be provided for you. Contact your provider if you have not received your monitor within 1 week.   Helpful Tips for Accurate Home Blood Pressure Checks  Don't smoke, exercise, or drink caffeine 30 minutes before checking your BP Use the restroom before checking your BP (a full bladder can raise your pressure) Relax in a comfortable upright chair Feet on the ground Left arm resting comfortably on a flat surface at the level of your heart Legs uncrossed Back supported Sit quietly and don't talk Place the cuff on your bare arm Adjust snuggly, so that only two fingertips  can fit between your skin and the top of the cuff Check 2 readings separated by at least one minute Keep a log of your BP readings For a visual, please reference this diagram: http://ccnc.care/bpdiagram  Provider Name: [facility]     Phone: [phone]  Zone 1: ALL CLEAR  Continue to monitor your symptoms:  BP reading is less than 140 (top number) or less than 90 (bottom number)  No right  upper stomach pain No headaches or seeing spots No feeling nauseated or throwing up No swelling in face and hands  Zone 2: CAUTION Call your doctor's office for any of the following:  BP reading is greater than 140 (top number) or greater than 90 (bottom number)  Stomach pain under your ribs in the middle or right side Headaches or seeing spots Feeling nauseated or throwing up Swelling in face and hands  Zone 3: EMERGENCY  Seek immediate medical care if you have any of the following:  BP reading is greater than160 (top number) or greater than 110 (bottom number) Severe headaches not improving with Tylenol Serious difficulty catching your breath Any worsening symptoms from Zone 2   Braxton Hicks Contractions Contractions of the uterus can occur throughout pregnancy, but they are not always a sign that you are in labor. You may have practice contractions called Braxton Hicks contractions. These false labor contractions are sometimes confused with true labor. What are [place] contractions? Braxton Hicks contractions are tightening movements that occur in the muscles of the uterus before labor. Unlike true labor contractions, these contractions do not result in opening (dilation) and thinning of the cervix. Toward the end of pregnancy (32-34 weeks), Braxton Hicks contractions can happen more often and may become stronger. These contractions are sometimes difficult to tell apart from true labor because they can be very uncomfortable. You should not feel embarrassed if you go to the hospital with false labor. Sometimes, the only way to tell if you are in true labor is for your health care provider to look for changes in the cervix. The health care provider will do a physical exam and may monitor your contractions. If you are not in true labor, the exam should show that your cervix is not dilating and your water has not broken. If there are no other health problems associated with your  pregnancy, it is completely safe for you to be sent home with false labor. You may continue to have Braxton Hicks contractions until you go into true labor. How to tell the difference between true labor and false labor True labor Contractions last 30-70 seconds. Contractions become very regular. Discomfort is usually felt in the top of the uterus, and it spreads to the lower abdomen and low back. Contractions do not go away with walking. Contractions usually become more intense and increase in frequency. The cervix dilates and gets thinner. False labor Contractions are usually shorter and not as strong as true labor contractions. Contractions are usually irregular. Contractions are often felt in the front of the lower abdomen and in the groin. Contractions may go away when you walk around or change positions while lying down. Contractions get weaker and are shorter-lasting as time goes on. The cervix usually does not dilate or become thin. Follow these instructions at home:  Take over-the-counter and prescription medicines only as told by your health care provider. Keep up with your usual exercises and follow other instructions from your health care provider. Eat and drink lightly if you think  you are going into labor. If Braxton Hicks contractions are making you uncomfortable: Change your position from lying down or resting to walking, or change from walking to resting. Sit and rest in a tub of warm water. Drink enough fluid to keep your urine pale yellow. Dehydration may cause these contractions. Do slow and deep breathing several times an hour. Keep all follow-up prenatal visits as told by your health care provider. This is important. Contact a health care provider if: You have a fever. You have continuous pain in your abdomen. Get help right away if: Your contractions become stronger, more regular, and closer together. You have fluid leaking or gushing from your vagina. You pass  blood-tinged mucus (bloody show). You have bleeding from your vagina. You have low back pain that you never had before. You feel your baby's head pushing down and causing pelvic pressure. Your baby is not moving inside you as much as it used to. Summary Contractions that occur before labor are called Braxton Hicks contractions, false labor, or practice contractions. Braxton Hicks contractions are usually shorter, weaker, farther apart, and less regular than true labor contractions. True labor contractions usually become progressively stronger and regular, and they become more frequent. Manage discomfort from [facility] contractions by changing position, resting in a warm bath, drinking plenty of water, or practicing deep breathing. This information is not intended to replace advice given to you by your health care provider. Make sure you discuss any questions you have with your health care provider. Document Revised: 12/18/2016 Document Reviewed: 05/21/2016 Elsevier Patient Education  [place].

## 2020-10-09 ENCOUNTER — Inpatient Hospital Stay (HOSPITAL_COMMUNITY): Payer: Medicaid Other | Admitting: Anesthesiology

## 2020-10-09 ENCOUNTER — Encounter (HOSPITAL_COMMUNITY): Payer: Self-pay | Admitting: Obstetrics and Gynecology

## 2020-10-09 DIAGNOSIS — O26893 Other specified pregnancy related conditions, third trimester: Secondary | ICD-10-CM | POA: Diagnosis present

## 2020-10-09 DIAGNOSIS — O9952 Diseases of the respiratory system complicating childbirth: Secondary | ICD-10-CM | POA: Diagnosis present

## 2020-10-09 DIAGNOSIS — Z20822 Contact with and (suspected) exposure to covid-19: Secondary | ICD-10-CM | POA: Diagnosis present

## 2020-10-09 DIAGNOSIS — O99344 Other mental disorders complicating childbirth: Secondary | ICD-10-CM | POA: Diagnosis present

## 2020-10-09 DIAGNOSIS — J4520 Mild intermittent asthma, uncomplicated: Secondary | ICD-10-CM | POA: Diagnosis present

## 2020-10-09 DIAGNOSIS — Z3A39 39 weeks gestation of pregnancy: Secondary | ICD-10-CM | POA: Diagnosis not present

## 2020-10-09 DIAGNOSIS — Z6791 Unspecified blood type, Rh negative: Secondary | ICD-10-CM | POA: Diagnosis not present

## 2020-10-09 DIAGNOSIS — F411 Generalized anxiety disorder: Secondary | ICD-10-CM | POA: Diagnosis present

## 2020-10-09 DIAGNOSIS — Z880 Allergy status to penicillin: Secondary | ICD-10-CM | POA: Diagnosis not present

## 2020-10-09 LAB — CBC
HCT: 35.4 % — ABNORMAL LOW (ref 36.0–46.0)
HCT: 38 % (ref 36.0–46.0)
Hemoglobin: 11.5 g/dL — ABNORMAL LOW (ref 12.0–15.0)
Hemoglobin: 12.3 g/dL (ref 12.0–15.0)
MCH: 27.3 pg (ref 26.0–34.0)
MCH: 27.3 pg (ref 26.0–34.0)
MCHC: 32.4 g/dL (ref 30.0–36.0)
MCHC: 32.5 g/dL (ref 30.0–36.0)
MCV: 83.9 fL (ref 80.0–100.0)
MCV: 84.3 fL (ref 80.0–100.0)
Platelets: 267 10*3/uL (ref 150–400)
Platelets: 322 10*3/uL (ref 150–400)
RBC: 4.22 MIL/uL (ref 3.87–5.11)
RBC: 4.51 MIL/uL (ref 3.87–5.11)
RDW: 14.8 % (ref 11.5–15.5)
RDW: 14.8 % (ref 11.5–15.5)
WBC: 20.1 10*3/uL — ABNORMAL HIGH (ref 4.0–10.5)
WBC: 23.3 10*3/uL — ABNORMAL HIGH (ref 4.0–10.5)
nRBC: 0 % (ref 0.0–0.2)
nRBC: 0.1 % (ref 0.0–0.2)

## 2020-10-09 LAB — RESP PANEL BY RT-PCR (FLU A&B, COVID) ARPGX2
Influenza A by PCR: NEGATIVE
Influenza B by PCR: NEGATIVE
SARS Coronavirus 2 by RT PCR: NEGATIVE

## 2020-10-09 LAB — TYPE AND SCREEN
ABO/RH(D): B NEG
Antibody Screen: POSITIVE

## 2020-10-09 LAB — RPR: RPR Ser Ql: NONREACTIVE

## 2020-10-09 MED ORDER — LIDOCAINE-EPINEPHRINE (PF) 1.5 %-1:200000 IJ SOLN
INTRAMUSCULAR | Status: DC | PRN
  Administered 2020-10-09: 5 mL via PERINEURAL

## 2020-10-09 MED ORDER — DIPHENHYDRAMINE HCL 25 MG PO CAPS: 25.0000 mg | ORAL_CAPSULE | Freq: Four times a day (QID) | ORAL | Status: DC | PRN

## 2020-10-09 MED ORDER — PHENYLEPHRINE 40 MCG/ML (10ML) SYRINGE FOR IV PUSH (FOR BLOOD PRESSURE SUPPORT): 80.0000 ug | PREFILLED_SYRINGE | INTRAVENOUS | Status: DC | PRN

## 2020-10-09 MED ORDER — OXYTOCIN BOLUS FROM INFUSION
333.0000 mL | Freq: Once | INTRAVENOUS | Status: AC
  Administered 2020-10-09: 333 mL via INTRAVENOUS

## 2020-10-09 MED ORDER — LACTATED RINGERS IV SOLN: 500.0000 mL | INTRAVENOUS | Status: DC | PRN

## 2020-10-09 MED ORDER — FENTANYL-BUPIVACAINE-NACL 0.5-0.125-0.9 MG/250ML-% EP SOLN
12.0000 mL/h | EPIDURAL | Status: DC | PRN
  Administered 2020-10-09: 12 mL/h via EPIDURAL
  Filled 2020-10-09: qty 250

## 2020-10-09 MED ORDER — LACTATED RINGERS IV SOLN: INTRAVENOUS | Status: DC

## 2020-10-09 MED ORDER — WITCH HAZEL-GLYCERIN EX PADS: 1.0000 "application " | MEDICATED_PAD | CUTANEOUS | Status: DC | PRN

## 2020-10-09 MED ORDER — PRENATAL MULTIVITAMIN CH
1.0000 | ORAL_TABLET | Freq: Every day | ORAL | Status: DC
  Administered 2020-10-09 – 2020-10-10 (×2): 1 via ORAL
  Filled 2020-10-09 (×2): qty 1

## 2020-10-09 MED ORDER — RHO D IMMUNE GLOBULIN 1500 UNIT/2ML IJ SOSY
300.0000 ug | PREFILLED_SYRINGE | Freq: Once | INTRAMUSCULAR | Status: AC
  Administered 2020-10-09: 300 ug via INTRAMUSCULAR
  Filled 2020-10-09: qty 2

## 2020-10-09 MED ORDER — ZOLPIDEM TARTRATE 5 MG PO TABS: 5.0000 mg | ORAL_TABLET | Freq: Every evening | ORAL | Status: DC | PRN

## 2020-10-09 MED ORDER — ACETAMINOPHEN 325 MG PO TABS
650.0000 mg | ORAL_TABLET | ORAL | Status: DC | PRN
  Administered 2020-10-09: 650 mg via ORAL
  Filled 2020-10-09: qty 2

## 2020-10-09 MED ORDER — LIDOCAINE HCL (PF) 1 % IJ SOLN
INTRAMUSCULAR | Status: DC | PRN
  Administered 2020-10-09: 5 mL via EPIDURAL

## 2020-10-09 MED ORDER — ONDANSETRON HCL 4 MG PO TABS: 4.0000 mg | ORAL_TABLET | ORAL | Status: DC | PRN

## 2020-10-09 MED ORDER — SIMETHICONE 80 MG PO CHEW: 80.0000 mg | CHEWABLE_TABLET | ORAL | Status: DC | PRN

## 2020-10-09 MED ORDER — DIPHENHYDRAMINE HCL 50 MG/ML IJ SOLN: 12.5000 mg | INTRAMUSCULAR | Status: DC | PRN

## 2020-10-09 MED ORDER — EPHEDRINE 5 MG/ML INJ: 10.0000 mg | INTRAVENOUS | Status: DC | PRN

## 2020-10-09 MED ORDER — ONDANSETRON HCL 4 MG/2ML IJ SOLN: 4.0000 mg | INTRAMUSCULAR | Status: DC | PRN

## 2020-10-09 MED ORDER — LIDOCAINE HCL (PF) 1 % IJ SOLN: 30.0000 mL | INTRAMUSCULAR | Status: DC | PRN

## 2020-10-09 MED ORDER — IBUPROFEN 600 MG PO TABS
600.0000 mg | ORAL_TABLET | Freq: Four times a day (QID) | ORAL | Status: DC
  Administered 2020-10-09 – 2020-10-10 (×6): 600 mg via ORAL
  Filled 2020-10-09 (×6): qty 1

## 2020-10-09 MED ORDER — ACETAMINOPHEN 325 MG PO TABS
650.0000 mg | ORAL_TABLET | ORAL | Status: DC | PRN
Start: 2020-10-09 — End: 2020-10-09

## 2020-10-09 MED ORDER — BENZOCAINE-MENTHOL 20-0.5 % EX AERO
1.0000 "application " | INHALATION_SPRAY | CUTANEOUS | Status: DC | PRN
  Administered 2020-10-09: 1 via TOPICAL
  Filled 2020-10-09: qty 56

## 2020-10-09 MED ORDER — DIBUCAINE (PERIANAL) 1 % EX OINT: 1.0000 "application " | TOPICAL_OINTMENT | CUTANEOUS | Status: DC | PRN

## 2020-10-09 MED ORDER — OXYCODONE-ACETAMINOPHEN 5-325 MG PO TABS: 2.0000 | ORAL_TABLET | ORAL | Status: DC | PRN

## 2020-10-09 MED ORDER — TETANUS-DIPHTH-ACELL PERTUSSIS 5-2.5-18.5 LF-MCG/0.5 IM SUSY: 0.5000 mL | PREFILLED_SYRINGE | Freq: Once | INTRAMUSCULAR | Status: DC

## 2020-10-09 MED ORDER — OXYTOCIN-SODIUM CHLORIDE 30-0.9 UT/500ML-% IV SOLN
2.5000 [IU]/h | INTRAVENOUS | Status: DC
  Filled 2020-10-09: qty 500

## 2020-10-09 MED ORDER — ONDANSETRON HCL 4 MG/2ML IJ SOLN: 4.0000 mg | Freq: Four times a day (QID) | INTRAMUSCULAR | Status: DC | PRN

## 2020-10-09 MED ORDER — SOD CITRATE-CITRIC ACID 500-334 MG/5ML PO SOLN: 30.0000 mL | ORAL | Status: DC | PRN

## 2020-10-09 MED ORDER — COCONUT OIL OIL
1.0000 "application " | TOPICAL_OIL | Status: DC | PRN
  Administered 2020-10-09: 1 via TOPICAL

## 2020-10-09 MED ORDER — LACTATED RINGERS IV SOLN: 500.0000 mL | Freq: Once | INTRAVENOUS | Status: DC

## 2020-10-09 MED ORDER — SENNOSIDES-DOCUSATE SODIUM 8.6-50 MG PO TABS
2.0000 | ORAL_TABLET | ORAL | Status: DC
  Administered 2020-10-10: 2 via ORAL
  Filled 2020-10-09 (×2): qty 2

## 2020-10-09 MED ORDER — PHENYLEPHRINE 40 MCG/ML (10ML) SYRINGE FOR IV PUSH (FOR BLOOD PRESSURE SUPPORT)
80.0000 ug | PREFILLED_SYRINGE | INTRAVENOUS | Status: DC | PRN
Start: 2020-10-09 — End: 2020-10-09

## 2020-10-09 MED ORDER — OXYCODONE-ACETAMINOPHEN 5-325 MG PO TABS
1.0000 | ORAL_TABLET | ORAL | Status: DC | PRN
Start: 2020-10-09 — End: 2020-10-09

## 2020-10-09 NOTE — H&P (Signed)
OBSTETRIC ADMISSION HISTORY AND PHYSICAL  [name] is a 25 y.o. female G60P2002 with IUP at [redacted]w[redacted]d by 6 week [place] presenting for SOL. She reports +FMs, no LOF, no VB, no blurry vision, headaches, peripheral edema, or RUQ pain.  She plans on breast feeding. She requests POPs as a bridge to vasectomy for birth control postpartum.  She received her prenatal care at [facility]   Dating: By [place] --->  Estimated Date of Delivery: 10/13/20  Sono:   @[redacted]w[redacted]d , CWD, normal anatomy, cephalic presentation, posterior placental lie, 261 g, 70% EFW  Prenatal History/Complications:  Generalized anxiety disorder  Rh negative status (B neg) Increased carrier risk for SMA (FOB negative)  Past Medical History: Past Medical History:  Diagnosis Date   Anxiety    Asthma    Chlamydia infection    Depression 2016   Eczema    Headache    Pregnant    Pregnant 06/13/2014   Scoliosis    Tinnitus     Past Surgical History: Past Surgical History:  Procedure Laterality Date   ADENOIDECTOMY     TONSILLECTOMY AND ADENOIDECTOMY      Obstetrical History: OB History     Gravida  3   Para  2   Term  2   Preterm  0   AB  0   Living  2      SAB  0   IAB  0   Ectopic  0   Multiple  0   Live Births  2           Social History Social History   Socioeconomic History   Marital status: Married    Spouse name: Not on file   Number of children: 2   Years of education: in college   Highest education level: Not on file  Occupational History   Occupation: full-time 06/15/2014   Occupation: Consulting civil engineer - office job   Occupation: help w/ teenage pregnancy program  Tobacco Use   Smoking status: Never   Smokeless tobacco: Never  Vaping Use   Vaping Use: Never used  Substance and Sexual Activity   Alcohol use: No   Drug use: No   Sexual activity: Yes    Birth control/protection: None  Other Topics Concern   Not on file  Social History Narrative   Lives at home with husband and  two children.     Right-handed.   Occasional use of caffeine.   Social Determinants of Health   Financial Resource Strain: Medium Risk   Difficulty of Paying Living Expenses: Somewhat hard  Food Insecurity: No Food Insecurity   Worried About Geophysicist/field seismologist in the Last Year: Never true   Ran Out of Food in the Last Year: Never true  Transportation Needs: No Transportation Needs   Lack of Transportation (Medical): No   Lack of Transportation (Non-Medical): No  Physical Activity: Insufficiently Active   Days of Exercise per Week: 3 days   Minutes of Exercise per Session: 30 min  Stress: Stress Concern Present   Feeling of Stress : Rather much  Social Connections: Socially Integrated   Frequency of Communication with Friends and Family: More than three times a week   Frequency of Social Gatherings with Friends and Family: Once a week   Attends Religious Services: More than 4 times per year   Active Member of Programme researcher, broadcasting/film/video or Organizations: Yes   Attends [employer] Meetings: More than 4 times per year   Marital Status:  Married    Family History: Family History  Problem Relation Age of Onset   Hypertension Father    Depression Father    Vision loss Father    Asthma Father    COPD Father    Asthma Paternal Aunt    Thyroid disease Paternal Aunt    Hyperlipidemia Maternal Grandfather    Heart disease Paternal Grandmother    Hypertension Paternal Grandmother    Diabetes Paternal Grandfather    Hyperlipidemia Paternal Grandfather    Asthma Paternal Aunt    Seizures Mother    Other Mother        history of drug abuse   Colon cancer Neg Hx    Colon polyps Neg Hx     Allergies: Allergies  Allergen Reactions   Nortriptyline Palpitations   Penicillins     Unsure of reaction.Has patient had a PCN reaction causing immediate rash, facial/tongue/throat swelling, SOB or lightheadedness with hypotension unknown Has patient had a PCN reaction causing severe rash involving  mucus membranes or skin necrosis: No Has patient had a PCN reaction that required hospitalization No Has patient had a PCN reaction occurring within the last 10 years:no If all of the above answers are "NO", then may proceed with Cephalosporin use.    Medications Prior to Admission  Medication Sig Dispense Refill Last Dose   Prenatal Vit-Fe Fumarate-FA (PRENATAL VITAMIN PO) Take by mouth.   10/08/2020   acetaminophen (TYLENOL) 325 MG tablet Take 650 mg by mouth every 6 (six) hours as needed. (Patient not taking: No sig reported)      ALBUTEROL SULFATE HFA IN Inhale into the lungs as needed. Reported on 01/02/2015   More than a month   azelastine (ASTELIN) 0.1 % nasal spray 2 sprays in each nostril 1-2 times daily as needed. (Patient not taking: Reported on 10/08/2020) 30 mL 5    ciclesonide (ALVESCO) 160 MCG/ACT inhaler Inhale 1 puff into the lungs 2 (two) times daily. 1 each 0 More than a month   loratadine (CLARITIN) 10 MG tablet Take 10 mg by mouth daily.   More than a month     Review of Systems  All systems reviewed and negative except as stated in HPI  Blood pressure 128/80, pulse 86, temperature 97.8 F (36.6 C), temperature source Oral, last menstrual period 12/20/2019.  General appearance: alert, fatigued, and moderate distress Lungs: normal work of breathing on room air  Heart: normal rate, warm and well-perfused  Abdomen: soft, non-tender, gravid Extremities: no LE edema or calf tenderness to palpation   Presentation:  Cephalic Fetal monitoring: Baseline 115, moderate variability, + accels, no decels  Uterine activity:  Dilation: 7 Effacement (%): 90 Station: 0 Exam by:: [name], CNM   Prenatal labs: ABO, Rh: --/--/PENDING (09/20 2350) Antibody: PENDING (09/20 2350) Rubella: 11.00 (03/14 1425) RPR: Non Reactive (06/22 0901)  HBsAg: Negative (03/14 1425)  HIV: Non Reactive (06/22 0901)  GBS: --Theda Sers (09/02 1400)  1 hr Glucola normal  Genetic screening -  Increased carrier risk for SMA (FOB neg) Anatomy US normal   Prenatal Transfer Tool  Maternal Diabetes: No Genetic Screening: Increased carrier risk for SMA (FOB neg) Maternal Ultrasounds/Referrals: Normal Fetal Ultrasounds or other Referrals:  None Maternal Substance Abuse:  No Significant Maternal Medications:  None Significant Maternal Lab Results: Group B Strep negative and Rh negative  Results for orders placed or performed during the hospital encounter of 10/08/20 (from the past 24 hour(s))  Type and screen [facility]  Collection Time: 10/08/20 11:50 PM  Result Value Ref Range   ABO/RH(D) PENDING    Antibody Screen PENDING    Sample Expiration      10/11/2020,2359 Performed at [facility], [address], [place], [place] [address]   CBC   Collection Time: 10/09/20 12:20 AM  Result Value Ref Range   WBC 20.1 (H) 4.0 - 10.5 K/uL   RBC 4.51 3.87 - 5.11 MIL/uL   Hemoglobin 12.3 12.0 - 15.0 g/dL   HCT 40.8 14.4 - 81.8 %   MCV 84.3 80.0 - 100.0 fL   MCH 27.3 26.0 - 34.0 pg   MCHC 32.4 30.0 - 36.0 g/dL   RDW 56.3 14.9 - 70.2 %   Platelets 322 150 - 400 K/uL   nRBC 0.1 0.0 - 0.2 %    Patient Active Problem List   Diagnosis Date Noted   Indication for care in labor or delivery 10/09/2020   Abnormal chromosomal and genetic finding on antenatal screening mother 04/15/2020   Abnormal Pap smear of cervix 04/04/2020   Encounter for supervision of normal pregnancy, antepartum 03/26/2020   Rh negative state in antepartum period 03/26/2020   Mild intermittent asthma, uncomplicated 09/27/2019   Recurrent infections 09/27/2019   Seasonal and perennial allergic rhinitis 09/27/2019   Generalized anxiety disorder 08/22/2019   Epidermal cyst of vulva 01/09/2019   Chronic migraine 09/28/2018   LLQ pain 07/30/2016   Mastalgia 01/01/2016   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 G3P2002 at [redacted]w[redacted]d here for  SOL.   #Labor: SVE changed from 3.5/80/-1 to 5.5/90/0 while in MAU. Continuing to progress spontaneously while on the labor and delivery floor.  #Pain:  Epidural as soon as able #FWB:  Cat 1  #ID:   GBS neg  #MOF:  Breast #MOC: POPs > partner vasectomy  #Circ:  No #Rh neg: may need rhogam after delivery   Derrel Nip, MD  10/09/2020, 1:15 AM

## 2020-10-09 NOTE — Anesthesia Procedure Notes (Signed)
Epidural Patient location during procedure: OB Start time: 10/09/2020 1:06 AM End time: 10/09/2020 1:20 AM  Staffing Anesthesiologist: Lucretia Kern, MD Performed: anesthesiologist   Preanesthetic Checklist Completed: patient identified, IV checked, risks and benefits discussed, monitors and equipment checked, pre-op evaluation and timeout performed  Epidural Patient position: sitting Prep: DuraPrep Patient monitoring: heart rate, continuous pulse ox and blood pressure Approach: midline Location: L3-L4 Injection technique: LOR saline  Needle:  Needle type: Tuohy  Needle gauge: 18 G Needle length: 9 cm Needle insertion depth: 5 cm Catheter type: closed end Catheter size: 20 Guage Catheter at skin depth: 10 cm Test dose: negative and 1.5% lidocaine with Epi 1:200 K  Assessment Events: blood not aspirated, injection not painful, no injection resistance, no paresthesia and negative IV test  Additional Notes Reason for block:procedure for pain

## 2020-10-09 NOTE — Anesthesia Preprocedure Evaluation (Signed)
Anesthesia Evaluation  ?Patient identified by MRN, date of birth, ID band ?Patient awake ? ? ? ?Reviewed: ?Allergy & Precautions, H&P , NPO status , Patient's Chart, lab work & pertinent test results ? ?History of Anesthesia Complications ?Negative for: history of anesthetic complications ? ?Airway ?Mallampati: II ? ?TM Distance: >3 FB ? ? ? ? Dental ?  ?Pulmonary ?asthma ,  ?  ?Pulmonary exam normal ? ? ? ? ? ? ? Cardiovascular ?negative cardio ROS ? ? ?Rhythm:regular Rate:Normal ? ? ?  ?Neuro/Psych ?negative neurological ROS ? negative psych ROS  ? GI/Hepatic ?negative GI ROS, Neg liver ROS,   ?Endo/Other  ?negative endocrine ROS ? Renal/GU ?negative Renal ROS  ?negative genitourinary ?  ?Musculoskeletal ? ? Abdominal ?  ?Peds ? Hematology ?negative hematology ROS ?(+)   ?Anesthesia Other Findings ? ? Reproductive/Obstetrics ?(+) Pregnancy ? ?  ? ? ? ? ? ? ? ? ? ? ? ? ? ?  ?  ? ? ? ? ? ? ? ? ?Anesthesia Physical ?Anesthesia Plan ? ?ASA: 2 ? ?Anesthesia Plan: Epidural  ? ?Post-op Pain Management:   ? ?Induction:  ? ?PONV Risk Score and Plan:  ? ?Airway Management Planned:  ? ?Additional Equipment:  ? ?Intra-op Plan:  ? ?Post-operative Plan:  ? ?Informed Consent: I have reviewed the patients History and Physical, chart, labs and discussed the procedure including the risks, benefits and alternatives for the proposed anesthesia with the patient or authorized representative who has indicated his/her understanding and acceptance.  ? ? ? ? ? ?Plan Discussed with:  ? ?Anesthesia Plan Comments:   ? ? ? ? ? ? ?Anesthesia Quick Evaluation ? ?

## 2020-10-09 NOTE — Lactation Note (Signed)
This note was copied from a baby's chart. Lactation Consultation Note  Patient Name: [name] Today's Date: 10/09/2020 Reason for consult: Follow-up assessment;Mother's request;Difficult latch;Term Age:25 hours LC assisted with helping infant latch since last feeding at 11 am.  LC placed infant on stomach with cross cradle position with breast compression.   Mom states infant gassiness and some bouts of clear emesis noted.    Once infant able to latch, Mom states doing okay and would not need to follow up with lactation.    Maternal Data    Feeding Mother's Current Feeding Choice: Breast Milk  LATCH Score Latch: Repeated attempts needed to sustain latch, nipple held in mouth throughout feeding, stimulation needed to elicit sucking reflex.  Audible Swallowing: A few with stimulation  Type of Nipple: Everted at rest and after stimulation  Comfort (Breast/Nipple): Soft / non-tender  Hold (Positioning): Assistance needed to correctly position infant at breast and maintain latch.  LATCH Score: 7   Lactation Tools Discussed/Used    Interventions Interventions: Breast feeding basics reviewed;Breast compression;Assisted with latch;Skin to skin;Support pillows;Breast massage;Position options;Hand express;Education;Expressed milk  Discharge Pump: Personal  Consult Status Consult Status: Complete (mother declined follow up)    Bernice Mcauliffe  Nicholson-Springer 10/09/2020, 4:41 PM

## 2020-10-09 NOTE — Anesthesia Postprocedure Evaluation (Signed)
Anesthesia Post Note  Patient: [name]  Procedure(s) Performed: AN AD HOC LABOR EPIDURAL     Patient location during evaluation: Mother Baby Anesthesia Type: Epidural Level of consciousness: awake and alert Pain management: pain level controlled Vital Signs Assessment: post-procedure vital signs reviewed and stable Respiratory status: spontaneous breathing, nonlabored ventilation and respiratory function stable Cardiovascular status: stable Postop Assessment: no headache, no backache, epidural receding, no apparent nausea or vomiting, patient able to bend at knees, adequate PO intake and able to ambulate Anesthetic complications: no   No notable events documented.  Last Vitals:  Vitals:   10/09/20 0450 10/09/20 0809  BP: 112/62 111/65  Pulse: 72 66  Resp: 16 16  Temp: 36.9 C 37 C  SpO2: 100% 100%    Last Pain:  Vitals:   10/09/20 0900  TempSrc:   PainSc: 0-No pain   Pain Goal:                   [name]

## 2020-10-09 NOTE — Lactation Note (Signed)
This note was copied from a baby's chart. Lactation Consultation Note  Patient Name: [name] Today's Date: 10/09/2020   Age:25 hours LC talked with RN Junie Panning) on  Vocera in L&D Unit and mom prefers to be seen on MBU instead of  in L&D.  Maternal Data    Feeding    LATCH Score Latch: Grasps breast easily, tongue down, lips flanged, rhythmical sucking.  Audible Swallowing: Spontaneous and intermittent  Type of Nipple: Everted at rest and after stimulation  Comfort (Breast/Nipple): Soft / non-tender  Hold (Positioning): No assistance needed to correctly position infant at breast.  LATCH Score: 10   Lactation Tools Discussed/Used    Interventions    Discharge    Consult Status      [name] 10/09/2020, 2:40 AM

## 2020-10-09 NOTE — Social Work (Signed)
CSW received consult for hx of Anxiety and Depression.  CSW met with MOB to offer support and complete assessment.    CSW met with MOB at bedside and introduced CSW role. CSW congratulated MOB. CSW observed MOB lying in bed, Infant sleeping in bassinet and FOB asleep on couch. MOB presented calm and welcomed CSW to complete the assessment with FOB present. CSW inquired how MOB has felt since giving birth. MOB expressed feeling good and tired. CSW inquired how MOB felt emotionally during the pregnancy. MOB shared she felt good with normal anxiety related to pregnancy. CSW inquired about MOB history of anxiety and depression. MOB disclosed she was diagnosed with anxiety as a child and depression as an adult. MOB reported taking Zoloft for her anxiety and depression symptoms prior to pregnancy and planned to resume Zoloft post-partum. MOB shared during pregnancy she managed well without the medication by implementing coping mechanisms. MOB shared she copes by journaling, crying, and speaking up for herself. CSW praised MOB for her efforts. MOB shared was in therapy about seven months ago before her therapist relocated, she hopes to find another therapist. CSW provided education regarding the baby blues period vs. perinatal mood disorders, discussed treatment and gave resources for mental health follow up if concerns arise. CSW recommended MOB complete a self-evaluation during the postpartum time period using the New Mom Checklist from Postpartum Progress and encouraged MOB to contact a medical professional if symptoms are noted at any time. 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 CSW assessed MOB for safety. MOB denied thoughts of harm to self and others. CSW inquired about MOB supports. MOB identified her spouse and church as supports.   CSW provided review of Sudden Infant Death Syndrome (SIDS) precautions. MOB reported she has essential items  for the infant including a bassinet and mini crib where the infant will sleep. MOB has chosen [facility] for infant's follow up care. CSW assessed MOB for additional needs. MOB reported no further need.   CSW identifies no further need for intervention and no barriers to discharge at this time.   Kathrin Greathouse, MSW, LCSW [facility]  [phone] 02-Mar-2020  3:10 PM

## 2020-10-09 NOTE — Discharge Summary (Addendum)
Postpartum Discharge Summary     Patient Name: [name] DOB: [birth year] MRN: [id number]  Date of admission: 10/08/2020 Delivery date:10/09/2020  Delivering provider: Gifford Shave  Date of discharge: 10/10/2020  Admitting diagnosis: Indication for care in labor or delivery [O75.9] Intrauterine pregnancy: [redacted]w[redacted]d    Secondary diagnosis:  Active Problems:   Rh negative state in antepartum period   Abnormal chromosomal and genetic finding on antenatal screening mother   Indication for care in labor or delivery  Additional problems: none    Discharge diagnosis: Term Pregnancy Delivered                                              Post partum procedures:rhogam Augmentation: AROM at time of delivery Complications: None  Hospital course: Onset of Labor With Vaginal Delivery      25y.o. yo [id number] 328w3das admitted in Latent Labor on 10/08/2020. Patient had an uncomplicated labor course as follows:  Membrane Rupture Time/Date: 1:54 AM ,10/09/2020   Delivery Method:Vaginal, Spontaneous  Episiotomy: None  Lacerations:  None  Patient had an uncomplicated postpartum course.  She is ambulating, tolerating a regular diet, passing flatus, and urinating well. Patient is discharged home in stable condition on 10/10/20 per her request for early d/c as long as the baby can go as well. She desires to start back on Zoloft, which she has taken in the past.  Newborn Data: Birth date:10/09/2020  Birth time:1:55 AM  Gender:Female  Living status:Living  Apgars:6 ,8  Weight:3501 g   Magnesium Sulfate received: No BMZ received: No Rhophylac:Yes MMR:N/A T-DaP:Given prenatally Flu: No Transfusion:No  Physical exam  Vitals:   10/09/20 1130 10/09/20 1457 10/09/20 2103 10/10/20 0500  BP: 112/76 115/72 122/67 106/72  Pulse: 68 75 66 72  Resp: '16 16 18 18  ' Temp: 98.2 F (36.8 C) 98.6 F (37 C) 98.1 F (36.7 C) 98.1 F (36.7 C)  TempSrc: Oral Oral Oral Oral  SpO2: 100% 100% 99%     General: alert and cooperative Lochia: appropriate Uterine Fundus: firm Incision: N/A DVT Evaluation: No evidence of DVT seen on physical exam. No significant calf/ankle edema. Labs: Lab Results  Component Value Date   WBC 23.3 (H) 10/09/2020   HGB 11.5 (L) 10/09/2020   HCT 35.4 (L) 10/09/2020   MCV 83.9 10/09/2020   PLT 267 10/09/2020   CMP 06/03/2007  Glucose 96  BUN 9  Creatinine 0.56  Sodium 136  Potassium 3.5  Chloride 105  CO2 29  Calcium 9.2  Total Protein 6.1  Total Bilirubin 0.8  Alkaline Phos 164  AST 22  ALT 18   Edinburgh Score: Edinburgh Postnatal Depression Scale Screening Tool 10/09/2020  I have been able to laugh and see the funny side of things. 0  I have looked forward with enjoyment to things. 0  I have blamed myself unnecessarily when things went wrong. 0  I have been anxious or worried for no good reason. 2  I have felt scared or panicky for no good reason. 1  Things have been getting on top of me. 0  I have been so unhappy that I have had difficulty sleeping. 0  I have felt sad or miserable. 1  I have been so unhappy that I have been crying. 0  The thought of harming myself has occurred to me. 0  Edinburgh Postnatal Depression Scale Total 4     After visit meds:  Allergies as of 10/10/2020       Reactions   Nortriptyline Palpitations   Penicillins    Unsure of reaction.Has patient had a PCN reaction causing immediate rash, facial/tongue/throat swelling, SOB or lightheadedness with hypotension unknown Has patient had a PCN reaction causing severe rash involving mucus membranes or skin necrosis: No Has patient had a PCN reaction that required hospitalization No Has patient had a PCN reaction occurring within the last 10 years:no If all of the above answers are "NO", then may proceed with Cephalosporin use.        Medication List     TAKE these medications    acetaminophen 325 MG tablet Commonly known as: TYLENOL Take 650 mg by  mouth every 6 (six) hours as needed.   ALBUTEROL SULFATE HFA IN Inhale into the lungs as needed. Reported on 01/02/2015   Alvesco 160 MCG/ACT inhaler Generic drug: ciclesonide Inhale 1 puff into the lungs 2 (two) times daily.   azelastine 0.1 % nasal spray Commonly known as: ASTELIN 2 sprays in each nostril 1-2 times daily as needed.   ibuprofen 600 MG tablet Commonly known as: ADVIL Take 1 tablet (600 mg total) by mouth every 6 (six) hours.   loratadine 10 MG tablet Commonly known as: CLARITIN Take 10 mg by mouth daily.   PRENATAL VITAMIN PO Take by mouth.   sertraline 50 MG tablet Commonly known as: Zoloft Take 1 tablet (50 mg total) by mouth daily.   Slynd 4 MG Tabs Generic drug: Drospirenone Take 4 mg by mouth daily.         Discharge home in stable condition Infant Feeding: Bottle and Breast Infant Disposition:home with mother Discharge instruction: per After Visit Summary and Postpartum booklet. Activity: Advance as tolerated. Pelvic rest for 6 weeks.  Diet: routine diet Future Appointments: Future Appointments  Date Time Provider [place]  11/13/2020 10:10 AM Roma Schanz, CNM [facility]   Follow up Visit: Roma Schanz, CNM  [place] Please schedule this patient for PP visit in: 4-6wks  Low risk pregnancy complicated by: nothing  Delivery mode:  SVD  Anticipated Birth Control:  POPs>vasectomy  PP Procedures needed: none  Schedule Integrated [place] visit: no  Provider: Any provider   10/10/2020 Myrtis Ser, CNM 9:21 AM

## 2020-10-10 LAB — RH IG WORKUP (INCLUDES ABO/RH)
Fetal Screen: NEGATIVE
Gestational Age(Wks): 39
Unit division: 0

## 2020-10-10 MED ORDER — SERTRALINE HCL 50 MG PO TABS: 50.0000 mg | ORAL_TABLET | Freq: Every day | ORAL | 0 refills | Status: DC

## 2020-10-10 MED ORDER — SLYND 4 MG PO TABS: 4.0000 mg | ORAL_TABLET | Freq: Every day | ORAL | 11 refills | Status: DC

## 2020-10-10 MED ORDER — SLYND 4 MG PO TABS: 4.0000 mg | ORAL_TABLET | Freq: Every day | ORAL | 11 refills | Status: AC

## 2020-10-10 MED ORDER — IBUPROFEN 600 MG PO TABS: 600.0000 mg | ORAL_TABLET | Freq: Four times a day (QID) | ORAL | 0 refills | Status: DC

## 2020-10-13 ENCOUNTER — Inpatient Hospital Stay (HOSPITAL_COMMUNITY): Admit: 2020-10-13 | Payer: Self-pay

## 2020-10-16 ENCOUNTER — Other Ambulatory Visit: Payer: Medicaid Other | Admitting: Advanced Practice Midwife

## 2020-10-21 ENCOUNTER — Telehealth (HOSPITAL_COMMUNITY): Payer: Self-pay | Admitting: *Deleted

## 2020-10-21 NOTE — Telephone Encounter (Signed)
Attempted hospital discharge follow-up call. Left message for patient to return RN call. Deforest Hoyles, RN, 10/21/20, 2122.

## 2020-11-13 ENCOUNTER — Other Ambulatory Visit: Payer: Self-pay

## 2020-11-13 ENCOUNTER — Ambulatory Visit (INDEPENDENT_AMBULATORY_CARE_PROVIDER_SITE_OTHER): Payer: Medicaid Other | Admitting: Women's Health

## 2020-11-13 ENCOUNTER — Encounter: Payer: Self-pay | Admitting: Women's Health

## 2020-11-13 DIAGNOSIS — Z3009 Encounter for other general counseling and advice on contraception: Secondary | ICD-10-CM

## 2020-11-13 NOTE — Progress Notes (Signed)
POSTPARTUM VISIT Patient name: [name] MRN [id number]  Date of birth: 1995-02-09 Chief Complaint:   Postpartum Care  History of Present Illness:   [name] is a 25 y.o. G17P3003 Caucasian female being seen today for a postpartum visit. She is 5 weeks postpartum following a spontaneous vaginal delivery at 39.3 gestational weeks. IOL: no, for n/a. Anesthesia: epidural.  Laceration: none.  Complications: none. Inpatient contraception: yes rx'd Slynd .   Pregnancy uncomplicated. Tobacco use: no. Substance use disorder: no. Last pap smear: 04/01/20 and results were ASCUS w/ HRHPV negative. Next pap smear due: 2025 Patient's last menstrual period was 12/20/2019 (approximate).  Postpartum course has been uncomplicated. Bleeding spotting. Bowel function is normal. Bladder function is normal. Urinary incontinence? no, fecal incontinence? no Patient is not sexually active. Last sexual activity: prior to birth of baby. Desired contraception:  POPs until vasectomy (husband has scheduled for Nov) . Patient does not want a pregnancy in the future.  Desired family size is 3 children.   Upstream - 11/13/20 1027       Pregnancy Intention Screening   Does the patient want to become pregnant in the next year? No    Does the patient's partner want to become pregnant in the next year? No    Would the patient like to discuss contraceptive options today? Yes      Contraception Wrap Up   Current Method Abstinence            The pregnancy intention screening data noted above was reviewed. Potential methods of contraception were discussed. The patient elected to proceed with No data recorded.  Edinburgh Postpartum Depression Screening: negative, doing well on zoloft  Edinburgh Postnatal Depression Scale - 11/13/20 1026       Edinburgh Postnatal Depression Scale:  In the Past 7 Days   I have been able to laugh and see the funny side of things. 0    I have looked forward with enjoyment to  things. 0    I have blamed myself unnecessarily when things went wrong. 1    I have been anxious or worried for no good reason. 1    I have felt scared or panicky for no good reason. 0    Things have been getting on top of me. 1    I have been so unhappy that I have had difficulty sleeping. 0    I have felt sad or miserable. 1    I have been so unhappy that I have been crying. 1    The thought of harming myself has occurred to me. 0    Edinburgh Postnatal Depression Scale Total 5             GAD 7 : Generalized Anxiety Score 07/10/2020 04/01/2020  Nervous, Anxious, on Edge 2 2  Control/stop worrying 0 0  Worry too much - different things 2 2  Trouble relaxing 2 2  Restless 0 0  Easily annoyed or irritable 2 3  Afraid - awful might happen 0 0  Total GAD 7 Score 8 9     Baby's course has been uncomplicated. Baby is feeding by breast: milk supply adequate. Infant has a pediatrician/family doctor? Yes.  Childcare strategy if returning to work/school: n/a-stay at home mom.  Pt has material needs met for her and baby: Yes.   Review of Systems:   Pertinent items are noted in HPI Denies Abnormal vaginal discharge w/ itching/odor/irritation, headaches, visual changes, shortness of breath, chest  pain, abdominal pain, severe nausea/vomiting, or problems with urination or bowel movements. Pertinent History Reviewed:  Reviewed past medical,surgical, obstetrical and family history.  Reviewed problem list, medications and allergies. OB History  Gravida Para Term Preterm AB Living  _0 0 0 3  SAB IAB Ectopic Multiple Live Births  0 0 0 0 3    # Outcome Date GA Lbr Len/2nd Weight Sex Delivery Anes PTL Lv  3 Term 10/09/20 [redacted]w[redacted]d/ 00:06 7 lb 11.5 oz (3.501 kg) M Vag-Spont EPI  LIV     Birth Comments: wnl  2 Term 01/09/15 332w0d9:00 / 00:15 6 lb 15.5 oz (3.161 kg) F Vag-Spont EPI N LIV  1 Term 06/10/13 3935w4d:51 / 00:19 7 lb 2.3 oz (3.24 kg) M Vag-Spont EPI N LIV   Physical Assessment:    Vitals:   11/13/20 1024  Weight: 149 lb (67.6 kg)  Height: _1  (1.676 m)  Body mass index is 24.05 kg/m.       Physical Examination:   General appearance: alert, well appearing, and in no distress  Mental status: alert, oriented to person, place, and time  Skin: warm & dry   Cardiovascular: normal heart rate noted   Respiratory: normal respiratory effort, no distress   Breasts: deferred, no complaints   Abdomen: soft, non-tender   Pelvic: examination not indicated. Thin prep pap obtained: No  Rectal: not examined  Extremities: Edema: none   Chaperone: N/A         No results found for this or any previous visit (from the past 24 hour(s)).  Assessment & Plan:  1) Postpartum exam 2) 5 wks s/p spontaneous vaginal delivery 3) breast feeding 4) Depression screening 5) Contraception counseling> ok to start Slynd rx'd from hospital, husband getting vasectomy in Nov  Essential components of care per ACOG recommendations:  1.  Mood and well being:  If positive depression screen, discussed and plan developed.  If using tobacco we discussed reduction/cessation and risk of relapse If current substance abuse, we discussed and referral to local resources was offered.   2. Infant care and feeding:  If breastfeeding, discussed returning to work, pumping, breastfeeding-associated pain, guidance regarding return to fertility while lactating if not using another method. If needed, patient was provided with a letter to be allowed to pump q 2-3hrs to support lactation in a private location with access to a refrigerator to store breastmilk.   Recommended that all caregivers be immunized for flu, pertussis and other preventable communicable diseases If pt does not have material needs met for her/baby, referred to local resources for help obtaining these.  3. Sexuality, contraception and birth spacing Provided guidance regarding sexuality, management of dyspareunia, and resumption of  intercourse Discussed avoiding interpregnancy interval <[id number] recommended birth spacing of 18 months  4. Sleep and fatigue Discussed coping options for fatigue and sleep disruption Encouraged family/partner/community support of 4 hrs of uninterrupted sleep to help with mood and fatigue  5. Physical recovery  If pt had a C/S, assessed incisional pain and providing guidance on normal vs prolonged recovery If pt had a laceration, perineal healing and pain reviewed.  If urinary or fecal incontinence, discussed management and referred to PT or uro/gyn if indicated  Patient is safe to resume physical activity. Discussed attainment of healthy weight.  6.  Chronic disease management Discussed pregnancy complications if any, and their implications for future childbearing and long-term maternal health. Review recommendations for prevention of recurrent pregnancy complications, such as  17 hydroxyprogesterone caproate to reduce risk for recurrent PTB not applicable, or aspirin to reduce risk of preeclampsia not applicable. Pt had GDM: no. If yes, 2hr GTT scheduled: not applicable. Reviewed medications and non-pregnant dosing including consideration of whether pt is breastfeeding using a reliable resource such as LactMed: not applicable Referred for f/u w/ PCP or subspecialist providers as indicated: not applicable  7. Health maintenance Mammogram at 25yo or earlier if indicated Pap smears as indicated  Meds: No orders of the defined types were placed in this encounter.   Follow-up: Return in about 1 year (around 11/13/2021) for Physical.   No orders of the defined types were placed in this encounter.   [place], [facility] 11/13/2020 10:44 AM

## 2022-03-26 ENCOUNTER — Ambulatory Visit: Payer: Medicaid Other | Admitting: Adult Health

## 2022-10-26 ENCOUNTER — Ambulatory Visit: Payer: Medicaid Other

## 2022-10-26 ENCOUNTER — Ambulatory Visit
Admission: RE | Admit: 2022-10-26 | Discharge: 2022-10-26 | Disposition: A | Discharge status: Home / Self Care | Payer: Medicaid Other | Source: Ambulatory Visit

## 2022-10-26 VITALS — BP 114/80 | HR 76 | Temp 98.1°F | Resp 16

## 2022-10-26 DIAGNOSIS — S93602A Unspecified sprain of left foot, initial encounter: Secondary | ICD-10-CM

## 2022-10-26 NOTE — ED Provider Notes (Signed)
[facility]    CSN: [id number] Arrival date & time: 10/26/22  0845      History   Chief Complaint Chief Complaint  Patient presents with   Foot Injury    Sharp foot pain when walking in the [place] area (near arch and heal) gets worse. Started Saturday. Stepped wrong when walking twice that day. Notice the pain getting worse. - Entered by patient    HPI [name] is a 27 y.o. female.   Patient presents today with 2-day history of left foot pain.  Reports she was walking when she accidentally stepped incorrectly and twisted her foot.  Reports the pain did not start until after she was walking on it for a while.  She denies numbness or tingling in the toes.  Reports that hurts with weightbearing or range of motion.  Elevation helps with the pain temporarily.    Past Medical History:  Diagnosis Date   Anxiety    Asthma    Chlamydia infection    Depression 2016   Eczema    Headache    Pregnant    Pregnant 06/13/2014   Scoliosis    Tinnitus     Patient Active Problem List   Diagnosis Date Noted   Abnormal chromosomal and genetic finding on antenatal screening mother 04/15/2020   Abnormal Pap smear of cervix 04/04/2020   Mild intermittent asthma, uncomplicated 09/27/2019   Recurrent infections 09/27/2019   Seasonal and perennial allergic rhinitis 09/27/2019   Generalized anxiety disorder 08/22/2019   Epidermal cyst of vulva 01/09/2019   Chronic migraine 09/28/2018   LLQ pain 07/30/2016   Mastalgia 01/01/2016   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 Authorizing  Provider  ALBUTEROL SULFATE HFA IN Inhale into the lungs as needed. Reported on 01/02/2015   Yes [provider]  FLUoxetine (PROZAC) 20 MG capsule Take 20 mg by mouth daily.   Yes [provider]  loratadine (CLARITIN) 10 MG tablet Take 10 mg by mouth daily.   Yes [provider]  acetaminophen (TYLENOL) 325 MG tablet Take 650 mg by mouth every 6 (six) hours as needed.    [provider]  Cetirizine HCl (ZYRTEC PO) Take by mouth.    [provider]  ciclesonide (ALVESCO) 160 MCG/ACT inhaler Inhale 1 puff into the lungs 2 (two) times daily. 09/27/19   Alfonse Spruce, MD  Drospirenone (SLYND) 4 MG TABS Take 4 mg by mouth daily. Patient not taking: Reported on 11/13/2020 10/27/20   Arabella Merles, CNM  Prenatal Vit-Fe Fumarate-FA (PRENATAL VITAMIN PO) Take by mouth.    [provider]  sertraline (ZOLOFT) 50 MG tablet Take 50 mg by mouth daily.    [provider]    Family History Family History  Problem Relation Age of Onset   Diabetes Paternal  Grandfather    Hyperlipidemia Paternal Grandfather    Heart disease Paternal Grandmother    Hypertension Paternal Grandmother    Hyperlipidemia Maternal Grandfather    Hypertension Father    Depression Father    Vision loss Father    Asthma Father    COPD Father    Seizures Mother    Other Mother        history of drug abuse   Asthma Paternal Aunt    Thyroid disease Paternal Aunt    Asthma Paternal Aunt    Colon cancer Neg Hx    Colon polyps Neg Hx     Social History Social History   Tobacco Use   Smoking status: Never   Smokeless tobacco: Never  Vaping Use   Vaping status: Never Used  Substance Use Topics   Alcohol use: No   Drug use: No     Allergies   Nortriptyline and Penicillins   Review of Systems Review of Systems Per HPI  Physical Exam Triage Vital Signs ED Triage Vitals  Encounter Vitals Group     BP 10/26/22 0922 114/80     Systolic BP  Percentile --      Diastolic BP Percentile --      Pulse Rate 10/26/22 0922 76     Resp 10/26/22 0922 16     Temp 10/26/22 0922 98.1 F (36.7 C)     Temp Source 10/26/22 0922 Oral     SpO2 10/26/22 0922 96 %     Weight --      Height --      Head Circumference --      Peak Flow --      Pain Score 10/26/22 0923 6     Pain Loc --      Pain Education --      Exclude from Growth Chart --    No data found.  Updated Vital Signs BP 114/80 (BP Location: Right Arm)   Pulse 76   Temp 98.1 F (36.7 C) (Oral)   Resp 16   LMP 10/15/2022 (Approximate)   SpO2 96%   Breastfeeding No   Visual Acuity Right Eye Distance:   Left Eye Distance:   Bilateral Distance:    Right Eye Near:   Left Eye Near:    Bilateral Near:     Physical Exam Vitals and nursing note reviewed.  Constitutional:      General: She is not in acute distress.    Appearance: Normal appearance. She is not toxic-appearing.  HENT:     Mouth/Throat:     Mouth: Mucous membranes are moist.     Pharynx: Oropharynx is clear.  Pulmonary:     Effort: Pulmonary effort is normal. No respiratory distress.  Musculoskeletal:     Comments: Inspection: mild swelling to the base of the 4th and 5th metatarsals; no bruising, obvious deformity or redness Palpation: tender to palpation to base of 4th and 5th metatarsals; no obvious deformities palpated ROM: Full ROM to left foot and ankle, although pain with ROM Strength: difficult to assess secondary to pain Neurovascular: neurovascularly intact in bilateral distal lower extremities   Skin:    General: Skin is warm and dry.     Capillary Refill: Capillary refill takes less than 2 seconds.     Coloration: Skin is not jaundiced or pale.     Findings: No erythema.  Neurological:     Mental Status: She is alert and oriented to person, place, and time.  Psychiatric:  Behavior: Behavior is cooperative.      UC Treatments / Results  Labs (all labs ordered are listed,  but only abnormal results are displayed) Labs Reviewed - No data to display  EKG   Radiology DG Foot Complete Left  Result Date: 10/26/2022 CLINICAL DATA:  Left foot pain. EXAM: LEFT FOOT - COMPLETE 3+ VIEW COMPARISON:  None Available. FINDINGS: Normal bone mineralization. Joint spaces are preserved. No acute fracture or dislocation. The cortices are intact. IMPRESSION: Normal left foot radiographs. Electronically Signed   By: Neita Garnet M.D.   On: 10/26/2022 11:02    Procedures Procedures (including critical care time)  Medications Ordered in UC Medications - No data to display  Initial Impression / Assessment and Plan / UC Course  I have reviewed the triage vital signs and the nursing notes.  Pertinent labs & imaging results that were available during my care of the patient were reviewed by me and considered in my medical decision making (see chart for details).   Patient is well-appearing, normotensive, afebrile, not tachycardic, not tachypneic, oxygenating well on room air.    1. Foot sprain, left, initial encounter No red flags in history or on exam X-ray imaging reviewed prior to discharge by myself, no acute fractures visualized Discussed with patient that I will contact her later today if the x-ray shows anything where we will need to change treatment In the meantime, start, elevation, ice, Tylenol/Motrin as needed for pain  The patient was given the opportunity to ask questions.  All questions answered to their satisfaction.  The patient is in agreement to this plan.    Final Clinical Impressions(s) / UC Diagnoses   Final diagnoses:  Foot sprain, left, initial encounter     Discharge Instructions      Please wear the Ace wrap when you are bearing weight on your left foot.  When sitting down, keep your foot elevated and apply ice 15 minutes on, 45 minutes off every hour while awake.  You can take Tylenol or ibuprofen as needed for pain.  I will contact you later  today if the x-ray shows any broken bones.  As we discussed, I did not see any broken bones today but the x-rays not been formally read by a radiologist.    ED Prescriptions   None    PDMP not reviewed this encounter.   Valentino Nose, NP 10/26/22 1200

## 2022-10-26 NOTE — Discharge Instructions (Addendum)
Please wear the Ace wrap when you are bearing weight on your left foot.  When sitting down, keep your foot elevated and apply ice 15 minutes on, 45 minutes off every hour while awake.  You can take Tylenol or ibuprofen as needed for pain.  I will contact you later today if the x-ray shows any broken bones.  As we discussed, I did not see any broken bones today but the x-rays not been formally read by a radiologist.

## 2022-10-26 NOTE — ED Triage Notes (Signed)
Pt states she was walking Saturday and stepped wrong twice on her left foot, now having pain with walking and slight swelling.
